# Patient Record
Sex: Male | Born: 1995 | Race: Black or African American | Hispanic: No | Marital: Single
Health system: Southern US, Community
[De-identification: ages and names within clinical notes are randomized; demographics above are authoritative.]

## PROBLEM LIST (undated history)

## (undated) ENCOUNTER — Emergency Department (HOSPITAL_COMMUNITY): Discharge status: Absent without leave | Payer: No Typology Code available for payment source

## (undated) HISTORY — PX: UVULECTOMY: SHX2631
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
## 2016-02-27 ENCOUNTER — Ambulatory Visit (INDEPENDENT_AMBULATORY_CARE_PROVIDER_SITE_OTHER): Payer: Medicaid Other | Admitting: Family Medicine

## 2016-02-27 VITALS — BP 126/62 | HR 62 | Temp 97.8°F | Ht 68.0 in | Wt 142.2 lb

## 2016-02-27 DIAGNOSIS — Z7689 Persons encountering health services in other specified circumstances: Secondary | ICD-10-CM

## 2016-02-27 DIAGNOSIS — Z634 Disappearance and death of family member: Secondary | ICD-10-CM | POA: Diagnosis not present

## 2016-02-27 DIAGNOSIS — Z7189 Other specified counseling: Secondary | ICD-10-CM

## 2016-02-27 MED ORDER — MULTIVITAMINS PO CAPS: 1.0000 | ORAL_CAPSULE | Freq: Every day | ORAL | 3 refills | Status: DC

## 2016-02-27 NOTE — Patient Instructions (Addendum)
It was very good to meet you today!  Come back to see [place] in 4 weeks. We will discuss about your tests and other concerns you may have at that time.   We will send you with some vitamins for your appetite.
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
## 2016-03-23 ENCOUNTER — Ambulatory Visit: Payer: Medicaid Other | Admitting: Student

## 2016-03-24 ENCOUNTER — Ambulatory Visit: Payer: Medicaid Other | Admitting: Student

## 2016-05-18 ENCOUNTER — Ambulatory Visit (HOSPITAL_COMMUNITY)
Admission: EM | Admit: 2016-05-18 | Discharge: 2016-05-18 | Disposition: A | Discharge status: Home / Self Care | Payer: Medicaid Other | Attending: Family Medicine | Admitting: Family Medicine

## 2016-05-18 ENCOUNTER — Ambulatory Visit: Payer: Medicaid Other | Admitting: Internal Medicine

## 2016-05-18 ENCOUNTER — Encounter (HOSPITAL_COMMUNITY): Payer: Self-pay | Admitting: Emergency Medicine

## 2016-05-18 DIAGNOSIS — J069 Acute upper respiratory infection, unspecified: Secondary | ICD-10-CM | POA: Diagnosis not present

## 2016-05-18 DIAGNOSIS — B9789 Other viral agents as the cause of diseases classified elsewhere: Secondary | ICD-10-CM

## 2016-05-18 MED ORDER — IPRATROPIUM BROMIDE 0.06 % NA SOLN: 2.0000 | Freq: Four times a day (QID) | NASAL | 1 refills | Status: DC

## 2016-05-18 MED ORDER — PSEUDOEPH-BROMPHEN-DM 30-2-10 MG/5ML PO SYRP: 7.5000 mL | ORAL_SOLUTION | Freq: Four times a day (QID) | ORAL | 1 refills | Status: DC | PRN

## 2016-05-18 NOTE — ED Provider Notes (Signed)
[facility]    CSN: [id number] Arrival date & time: 05/18/16  1336     History   Chief Complaint Chief Complaint  Patient presents with  . URI    HPI Allen Rivas is a 20 y.o. male.   The history is provided by the patient.  URI  Presenting symptoms: congestion, cough and rhinorrhea   Presenting symptoms: no fever and no sore throat   Severity:  Mild Onset quality:  Gradual Duration:  1 week Progression:  Unchanged Chronicity:  New Relieved by:  None tried Worsened by:  Nothing Ineffective treatments:  None tried Risk factors: sick contacts     History reviewed. No pertinent past medical history.  Patient Active Problem List   Diagnosis Date Noted  . Loss or death of parent 03/27/16    Past Surgical History:  Procedure Laterality Date  . UVULECTOMY         Home Medications    Prior to Admission medications   Medication Sig Start Date End Date Taking? Authorizing Provider  Multiple Vitamin (MULTIVITAMIN) capsule Take 1 capsule by mouth daily. 03/27/16   Alveda Reasons, MD    Family History No family history on file.  Social History Social History  Substance Use Topics  . Smoking status: Never Smoker  . Smokeless tobacco: Never Used  . Alcohol use No     Allergies   Patient has no known allergies.   Review of Systems Review of Systems  Constitutional: Negative for fever.  HENT: Positive for congestion, postnasal drip and rhinorrhea. Negative for sore throat and trouble swallowing.   Respiratory: Positive for cough. Negative for shortness of breath.   Cardiovascular: Negative.   All other systems reviewed and are negative.    Physical Exam Triage Vital Signs ED Triage Vitals  Enc Vitals Group     BP 05/18/16 1354 139/76     Pulse Rate 05/18/16 1354 73     Resp 05/18/16 1354 22     Temp 05/18/16 1354 98.7 F (37.1 C)     Temp src --      SpO2 05/18/16 1354 100 %     Weight --      Height --      Head  Circumference --      Peak Flow --      Pain Score 05/18/16 1352 5     Pain Loc --      Pain Edu? --      Excl. in [place]? --    No data found.   Updated Vital Signs BP 139/76 (BP Location: Left Arm)   Pulse 73   Temp 98.7 F (37.1 C)   Resp 22   SpO2 100%   Visual Acuity Right Eye Distance:   Left Eye Distance:   Bilateral Distance:    Right Eye Near:   Left Eye Near:    Bilateral Near:     Physical Exam  Constitutional: He is oriented to person, place, and time. He appears well-developed and well-nourished. No distress.  HENT:  Right Ear: External ear normal.  Left Ear: External ear normal.  Nose: Mucosal edema and rhinorrhea present.  Mouth/Throat: Oropharynx is clear and moist.  Eyes: Pupils are equal, round, and reactive to light.  Neck: Normal range of motion. Neck supple.  Cardiovascular: Normal rate, regular rhythm, normal heart sounds and intact distal pulses.   Pulmonary/Chest: Effort normal and breath sounds normal.  Abdominal: Soft. There is no tenderness.  Lymphadenopathy:  He has no cervical adenopathy.  Neurological: He is alert and oriented to person, place, and time.  Skin: Skin is warm and dry.  Nursing note and vitals reviewed.    UC Treatments / Results  Labs (all labs ordered are listed, but only abnormal results are displayed) Labs Reviewed - No data to display  EKG  EKG Interpretation None       Radiology No results found.  Procedures Procedures (including critical care time)  Medications Ordered in UC Medications - No data to display   Initial Impression / Assessment and Plan / UC Course  I have reviewed the triage vital signs and the nursing notes.  Pertinent labs & imaging results that were available during my care of the patient were reviewed by me and considered in my medical decision making (see chart for details).  Clinical Course       Final Clinical Impressions(s) / UC Diagnoses   Final diagnoses:  None     New Prescriptions New Prescriptions   No medications on file     Billy Fischer, MD 05/18/16 1430

## 2016-05-18 NOTE — ED Triage Notes (Addendum)
One week history of headache, runny nose, sinus congestion, sore throat and chest soreness with coughing.  [place] been [place]

## 2016-06-02 ENCOUNTER — Encounter: Payer: Self-pay | Admitting: Student

## 2016-06-02 ENCOUNTER — Other Ambulatory Visit (HOSPITAL_COMMUNITY)
Admission: RE | Admit: 2016-06-02 | Discharge: 2016-06-02 | Disposition: A | Discharge status: Home / Self Care | Payer: Medicaid Other | Source: Ambulatory Visit | Attending: Family Medicine | Admitting: Family Medicine

## 2016-06-02 ENCOUNTER — Ambulatory Visit (INDEPENDENT_AMBULATORY_CARE_PROVIDER_SITE_OTHER): Payer: Medicaid Other | Admitting: Student

## 2016-06-02 VITALS — BP 122/84 | HR 57 | Temp 98.0°F | Ht 68.0 in | Wt 146.6 lb

## 2016-06-02 DIAGNOSIS — Z113 Encounter for screening for infections with a predominantly sexual mode of transmission: Secondary | ICD-10-CM | POA: Insufficient documentation

## 2016-06-02 DIAGNOSIS — Z0000 Encounter for general adult medical examination without abnormal findings: Secondary | ICD-10-CM

## 2016-06-02 DIAGNOSIS — Z7251 High risk heterosexual behavior: Secondary | ICD-10-CM

## 2016-06-02 DIAGNOSIS — Z23 Encounter for immunization: Secondary | ICD-10-CM | POA: Diagnosis present

## 2016-06-02 NOTE — Progress Notes (Signed)
  Subjective:    Allen Rivas is a 20 y.o. old male here for annual physical. In person interpretor by the name Lyndon Code from [place] was used for the entire encounter  HPI  Briefly, a 20 year old male originally from [place] who arrived in the [place] less than a year ago. Patient was initially seen in our clinic 3 months ago. At that visits he reported poor appetite that has resolved with multivitamin. He has no concern today. He states that he has been to a dentist recently.   History and Problem List: Boyce has Loss or death of parent on his problem list.  PMH: no significant past medical history   Social history: he is working. Reports regular exercise. He plays soccer. He denies smoking, drinking or recreational drug use. One sexual partner. Denies condom use.   Immunizations needed: influenza and TdaP  Review of Systems  Constitutional: Negative for appetite change, fatigue, fever and unexpected weight change.  Eyes: Negative for visual disturbance.  Respiratory: Negative for chest tightness and shortness of breath.   Cardiovascular: Negative for chest pain.  Gastrointestinal: Negative for blood in stool, constipation and diarrhea.  Endocrine: Negative for polydipsia, polyphagia and polyuria.  Genitourinary: Negative for dysuria, hematuria, penile swelling and scrotal swelling.  Musculoskeletal: Negative for arthralgias and myalgias.  Skin: Negative for pallor and rash.  Neurological: Negative for dizziness and light-headedness.  Hematological: Does not bruise/bleed easily.  Psychiatric/Behavioral: Negative for behavioral problems and dysphoric mood.      Objective:    BP 122/84   Pulse (!) 57   Temp 98 F (36.7 C) (Oral)   Ht 5\' 8"  (1.727 m)   Wt 146 lb 9.6 oz (66.5 kg)   SpO2 99%   BMI 22.29 kg/m  Physical Exam GEN: appears well, no apparent distress. Head: normocephalic and atraumatic  Eyes: some conjunctival injection (muddy sclera), PERRL Ears: normal TM and ear  canal,  Nares: no rhinorrhea, congestion or erythema,  Oropharynx: mmm without erythema or exudation. No dental caries HEM: negative for cervical or periauricular lymphadenopathies CVS: RRR, normal s1 and s2, no murmurs, no edema RESP: no increased work of breathing, good air movement bilaterally, no crackles or wheeze GI: Bowel sounds present and normal, soft, non-tender, non-distended, no guarding, no rebound, no mass GU: deferred MSK: No focal tenderness SKIN: No apparent skin lesion ENDO: negative thyromegally NEURO: alert and oiented appropriately, no gross defecits  PSYCH: appropriate mood and affect.    Assessment and Plan:     Harder was seen today for annual physical. He has no significant past medical history. We have discussed about healthy lifestyle including diet, exercise and safe sex practice. He is given handout about diet and exercise.  We have also addressed the following problems:   Problem List Items Addressed This Visit    None    Visit Diagnoses    Unprotected sex    -  Primary   Relevant Orders   Urine cytology ancillary only   HIV antibody   Encounter for immunization       Relevant Orders   Flu Vaccine QUAD 36+ mos IM (Completed)   Vaccine for diphtheria-tetanus-pertussis, combined       Relevant Orders   Tdap vaccine greater than or equal to 7yo IM (Completed)      Return in about 1 year (around 06/02/2017).  Mercy Riding, MD

## 2016-06-02 NOTE — Patient Instructions (Signed)
It was great seeing you today! Portion Size    Choose healthier foods such as 100% whole grains, vegetables, fruits, beans, nut seeds, olive oil, most vegetable oils, fat-free dietary, wild game and fish.   Avoid sweet tea, other sweetened beverages, soda, fruit juice, cold cereal and milk and trans fat.   Eat at least 3 meals and 1-2 snacks per day.  Aim for no more than 5 hours between eating.  Eat breakfast within one hour of getting up.    Exercise at least 150 minutes per week, including weight resistance exercises 3 or 4 times per week.    If we did any lab work today, and the results require attention, either me or my nurse will get in touch with you. If everything is normal, you will get a letter in mail. If you don't hear from [place] in two weeks, please give [place] a call. Otherwise, we look forward to seeing you again at your next visit. If you have any questions or concerns before then, please call the clinic at [phone].   Please bring all your medications to every doctors visit   Sign up for My Chart to have easy access to your labs results, and communication with your Primary care physician.     Please check-out at the front desk before leaving the clinic.

## 2016-06-03 ENCOUNTER — Encounter: Payer: Self-pay | Admitting: Student

## 2016-06-03 LAB — URINE CYTOLOGY ANCILLARY ONLY
Chlamydia: NEGATIVE
Neisseria Gonorrhea: NEGATIVE
TRICH (WINDOWPATH): NEGATIVE

## 2016-06-03 LAB — HIV ANTIBODY (ROUTINE TESTING W REFLEX): HIV 1&2 Ab, 4th Generation: NONREACTIVE

## 2016-06-03 NOTE — Progress Notes (Signed)
HIV/GC/CT/Trich normal. Result letter sent to patient

## 2017-05-23 ENCOUNTER — Emergency Department (HOSPITAL_COMMUNITY)
Admission: EM | Admit: 2017-05-23 | Discharge: 2017-05-23 | Disposition: A | Discharge status: Home / Self Care | Payer: Self-pay | Attending: Emergency Medicine | Admitting: Emergency Medicine

## 2017-05-23 ENCOUNTER — Other Ambulatory Visit: Payer: Self-pay

## 2017-05-23 ENCOUNTER — Encounter (HOSPITAL_COMMUNITY): Payer: Self-pay

## 2017-05-23 ENCOUNTER — Emergency Department (HOSPITAL_COMMUNITY): Payer: Self-pay

## 2017-05-23 DIAGNOSIS — S20211A Contusion of right front wall of thorax, initial encounter: Secondary | ICD-10-CM | POA: Insufficient documentation

## 2017-05-23 DIAGNOSIS — Y9389 Activity, other specified: Secondary | ICD-10-CM | POA: Insufficient documentation

## 2017-05-23 DIAGNOSIS — Y998 Other external cause status: Secondary | ICD-10-CM | POA: Insufficient documentation

## 2017-05-23 DIAGNOSIS — Y92410 Unspecified street and highway as the place of occurrence of the external cause: Secondary | ICD-10-CM | POA: Insufficient documentation

## 2017-05-23 DIAGNOSIS — F17210 Nicotine dependence, cigarettes, uncomplicated: Secondary | ICD-10-CM | POA: Insufficient documentation

## 2017-05-23 MED ORDER — IBUPROFEN 600 MG PO TABS: 600.0000 mg | ORAL_TABLET | Freq: Four times a day (QID) | ORAL | 0 refills | Status: DC | PRN
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
## 2017-05-23 NOTE — Discharge Instructions (Signed)
Return if any problems.

## 2017-05-26 ENCOUNTER — Other Ambulatory Visit: Payer: Self-pay

## 2017-05-26 ENCOUNTER — Emergency Department (HOSPITAL_COMMUNITY)
Admission: EM | Admit: 2017-05-26 | Discharge: 2017-05-26 | Disposition: A | Discharge status: Home / Self Care | Payer: Self-pay | Attending: Emergency Medicine | Admitting: Emergency Medicine

## 2017-05-26 ENCOUNTER — Encounter (HOSPITAL_COMMUNITY): Payer: Self-pay

## 2017-05-26 ENCOUNTER — Emergency Department (HOSPITAL_COMMUNITY): Payer: Self-pay

## 2017-05-26 DIAGNOSIS — Z79899 Other long term (current) drug therapy: Secondary | ICD-10-CM | POA: Insufficient documentation

## 2017-05-26 DIAGNOSIS — F17210 Nicotine dependence, cigarettes, uncomplicated: Secondary | ICD-10-CM | POA: Insufficient documentation

## 2017-05-26 DIAGNOSIS — M7918 Myalgia, other site: Secondary | ICD-10-CM

## 2017-05-26 DIAGNOSIS — M791 Myalgia, unspecified site: Secondary | ICD-10-CM | POA: Insufficient documentation

## 2017-05-26 MED ORDER — CYCLOBENZAPRINE HCL 10 MG PO TABS: 10.0000 mg | ORAL_TABLET | Freq: Two times a day (BID) | ORAL | 0 refills | Status: DC | PRN

## 2017-05-26 MED ORDER — CYCLOBENZAPRINE HCL 10 MG PO TABS
10.0000 mg | ORAL_TABLET | Freq: Once | ORAL | Status: AC
  Administered 2017-05-26: 10 mg via ORAL
  Filled 2017-05-26: qty 1
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
## 2017-05-26 NOTE — Discharge Instructions (Signed)
Do not take the muscle relaxer at work or while driving because it will make you sleepy.

## 2017-05-26 NOTE — ED Notes (Signed)
Pt decline d/c vitals. He states that he has to get to an appointment.
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
## 2018-01-10 ENCOUNTER — Encounter (HOSPITAL_COMMUNITY): Payer: Self-pay

## 2018-01-10 ENCOUNTER — Other Ambulatory Visit: Payer: Self-pay

## 2018-01-10 ENCOUNTER — Emergency Department (HOSPITAL_COMMUNITY): Payer: No Typology Code available for payment source

## 2018-01-10 ENCOUNTER — Emergency Department (HOSPITAL_COMMUNITY)
Admission: EM | Admit: 2018-01-10 | Discharge: 2018-01-10 | Disposition: A | Discharge status: Home / Self Care | Payer: No Typology Code available for payment source | Attending: Emergency Medicine | Admitting: Emergency Medicine

## 2018-01-10 DIAGNOSIS — F17210 Nicotine dependence, cigarettes, uncomplicated: Secondary | ICD-10-CM | POA: Diagnosis not present

## 2018-01-10 DIAGNOSIS — M7918 Myalgia, other site: Secondary | ICD-10-CM | POA: Insufficient documentation

## 2018-01-10 NOTE — ED Notes (Signed)
Pt reports increased chest wall pain and pressure on inspiration.

## 2018-01-10 NOTE — Discharge Instructions (Signed)
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
## 2018-03-13 ENCOUNTER — Emergency Department (HOSPITAL_COMMUNITY)
Admission: EM | Admit: 2018-03-13 | Discharge: 2018-03-14 | Disposition: A | Discharge status: Home / Self Care | Payer: Self-pay | Attending: Emergency Medicine | Admitting: Emergency Medicine

## 2018-03-13 ENCOUNTER — Encounter (HOSPITAL_COMMUNITY): Payer: Self-pay | Admitting: Emergency Medicine

## 2018-03-13 DIAGNOSIS — Z5321 Procedure and treatment not carried out due to patient leaving prior to being seen by health care provider: Secondary | ICD-10-CM | POA: Insufficient documentation

## 2018-03-13 DIAGNOSIS — M79621 Pain in right upper arm: Secondary | ICD-10-CM | POA: Insufficient documentation
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
## 2018-03-13 NOTE — ED Notes (Signed)
Pt LWBS 

## 2018-04-18 ENCOUNTER — Emergency Department (HOSPITAL_COMMUNITY): Payer: Self-pay

## 2018-04-18 ENCOUNTER — Emergency Department (HOSPITAL_COMMUNITY)
Admission: EM | Admit: 2018-04-18 | Discharge: 2018-04-18 | Disposition: A | Discharge status: Home / Self Care | Payer: Self-pay | Attending: Emergency Medicine | Admitting: Emergency Medicine

## 2018-04-18 ENCOUNTER — Encounter (HOSPITAL_COMMUNITY): Payer: Self-pay

## 2018-04-18 DIAGNOSIS — M25511 Pain in right shoulder: Secondary | ICD-10-CM | POA: Insufficient documentation

## 2018-04-18 DIAGNOSIS — Z5321 Procedure and treatment not carried out due to patient leaving prior to being seen by health care provider: Secondary | ICD-10-CM | POA: Insufficient documentation
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
## 2018-04-18 NOTE — ED Triage Notes (Signed)
Pt didn't answer when called from triage

## 2018-04-19 ENCOUNTER — Emergency Department (HOSPITAL_COMMUNITY): Payer: No Typology Code available for payment source

## 2018-04-19 ENCOUNTER — Other Ambulatory Visit: Payer: Self-pay

## 2018-04-19 ENCOUNTER — Emergency Department (HOSPITAL_COMMUNITY)
Admission: EM | Admit: 2018-04-19 | Discharge: 2018-04-19 | Disposition: A | Discharge status: Home / Self Care | Payer: No Typology Code available for payment source | Attending: Emergency Medicine | Admitting: Emergency Medicine

## 2018-04-19 DIAGNOSIS — F17210 Nicotine dependence, cigarettes, uncomplicated: Secondary | ICD-10-CM | POA: Diagnosis not present

## 2018-04-19 DIAGNOSIS — M25511 Pain in right shoulder: Secondary | ICD-10-CM | POA: Insufficient documentation

## 2018-04-19 DIAGNOSIS — G8929 Other chronic pain: Secondary | ICD-10-CM

## 2018-04-19 MED ORDER — IBUPROFEN 800 MG PO TABS: 800.0000 mg | ORAL_TABLET | Freq: Three times a day (TID) | ORAL | 0 refills | Status: DC
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
## 2018-04-19 NOTE — ED Notes (Signed)
Bed: WA03 Expected date:  Expected time:  Means of arrival:  Comments: 

## 2018-10-24 ENCOUNTER — Encounter (HOSPITAL_COMMUNITY): Payer: Self-pay

## 2018-10-24 ENCOUNTER — Ambulatory Visit (HOSPITAL_COMMUNITY)
Admission: EM | Admit: 2018-10-24 | Discharge: 2018-10-24 | Disposition: A | Discharge status: Home / Self Care | Payer: Self-pay | Attending: Family Medicine | Admitting: Family Medicine

## 2018-10-24 ENCOUNTER — Other Ambulatory Visit: Payer: Self-pay

## 2018-10-24 DIAGNOSIS — R1013 Epigastric pain: Secondary | ICD-10-CM

## 2018-10-24 MED ORDER — OMEPRAZOLE 20 MG PO CPDR: 20.0000 mg | DELAYED_RELEASE_CAPSULE | Freq: Every day | ORAL | 0 refills | Status: DC

## 2018-10-24 NOTE — ED Triage Notes (Signed)
Pt states he has had stomach pain for past month after he eats
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
## 2019-01-13 ENCOUNTER — Emergency Department (HOSPITAL_COMMUNITY)
Admission: EM | Admit: 2019-01-13 | Discharge: 2019-01-14 | Disposition: A | Discharge status: Home / Self Care | Payer: Self-pay | Attending: Emergency Medicine | Admitting: Emergency Medicine

## 2019-01-13 ENCOUNTER — Emergency Department (HOSPITAL_COMMUNITY): Payer: Self-pay

## 2019-01-13 ENCOUNTER — Other Ambulatory Visit: Payer: Self-pay

## 2019-01-13 DIAGNOSIS — Y908 Blood alcohol level of 240 mg/100 ml or more: Secondary | ICD-10-CM | POA: Insufficient documentation

## 2019-01-13 DIAGNOSIS — Z03818 Encounter for observation for suspected exposure to other biological agents ruled out: Secondary | ICD-10-CM | POA: Insufficient documentation

## 2019-01-13 DIAGNOSIS — F10929 Alcohol use, unspecified with intoxication, unspecified: Secondary | ICD-10-CM | POA: Insufficient documentation

## 2019-01-13 DIAGNOSIS — F17210 Nicotine dependence, cigarettes, uncomplicated: Secondary | ICD-10-CM | POA: Insufficient documentation

## 2019-01-13 LAB — COMPREHENSIVE METABOLIC PANEL
ALT: 43 U/L (ref 0–44)
AST: 61 U/L — ABNORMAL HIGH (ref 15–41)
Albumin: 4.1 g/dL (ref 3.5–5.0)
Alkaline Phosphatase: 55 U/L (ref 38–126)
Anion gap: 13 (ref 5–15)
BUN: 6 mg/dL (ref 6–20)
CO2: 17 mmol/L — ABNORMAL LOW (ref 22–32)
Calcium: 8.4 mg/dL — ABNORMAL LOW (ref 8.9–10.3)
Chloride: 114 mmol/L — ABNORMAL HIGH (ref 98–111)
Creatinine, Ser: 1.16 mg/dL (ref 0.61–1.24)
GFR calc Af Amer: >60 mL/min (ref >60)
GFR calc non Af Amer: >60 mL/min (ref >60)
Glucose, Bld: 73 mg/dL (ref 70–99)
Potassium: 3.7 mmol/L (ref 3.5–5.1)
Sodium: 144 mmol/L (ref 135–145)
Total Bilirubin: 0.5 mg/dL (ref 0.3–1.2)
Total Protein: 7 g/dL (ref 6.5–8.1)

## 2019-01-13 LAB — CK: Total CK: 140 U/L (ref 49–397)

## 2019-01-13 LAB — CBC WITH DIFFERENTIAL/PLATELET
Abs Immature Granulocytes: 0.02 10*3/uL (ref 0.00–0.07)
Basophils Absolute: 0 10*3/uL (ref 0.0–0.1)
Basophils Relative: 0 %
Eosinophils Absolute: 0.1 10*3/uL (ref 0.0–0.5)
Eosinophils Relative: 1 %
HCT: 44.8 % (ref 39.0–52.0)
Hemoglobin: 15.9 g/dL (ref 13.0–17.0)
Immature Granulocytes: 0 %
Lymphocytes Relative: 35 %
Lymphs Abs: 2.5 10*3/uL (ref 0.7–4.0)
MCH: 31.5 pg (ref 26.0–34.0)
MCHC: 35.5 g/dL (ref 30.0–36.0)
MCV: 88.9 fL (ref 80.0–100.0)
Monocytes Absolute: 0.6 10*3/uL (ref 0.1–1.0)
Monocytes Relative: 9 %
Neutro Abs: 3.8 10*3/uL (ref 1.7–7.7)
Neutrophils Relative %: 55 %
Platelets: 244 10*3/uL (ref 150–400)
RBC: 5.04 MIL/uL (ref 4.22–5.81)
RDW: 12.8 % (ref 11.5–15.5)
WBC: 7.1 10*3/uL (ref 4.0–10.5)
nRBC: 0 % (ref 0.0–0.2)

## 2019-01-13 LAB — URINALYSIS, ROUTINE W REFLEX MICROSCOPIC
Bilirubin Urine: NEGATIVE
Glucose, UA: 50 mg/dL — AB
Hgb urine dipstick: NEGATIVE
Ketones, ur: NEGATIVE mg/dL
Leukocytes,Ua: NEGATIVE
Nitrite: NEGATIVE
Protein, ur: NEGATIVE mg/dL
Specific Gravity, Urine: 1.004 — ABNORMAL LOW (ref 1.005–1.030)
pH: 5 (ref 5.0–8.0)

## 2019-01-13 LAB — I-STAT CHEM 8, ED
BUN: 5 mg/dL — ABNORMAL LOW (ref 6–20)
Calcium, Ion: 1.06 mmol/L — ABNORMAL LOW (ref 1.15–1.40)
Chloride: 114 mmol/L — ABNORMAL HIGH (ref 98–111)
Creatinine, Ser: 1.5 mg/dL — ABNORMAL HIGH (ref 0.61–1.24)
Glucose, Bld: 71 mg/dL (ref 70–99)
HCT: 47 % (ref 39.0–52.0)
Hemoglobin: 16 g/dL (ref 13.0–17.0)
Potassium: 3.7 mmol/L (ref 3.5–5.1)
Sodium: 145 mmol/L (ref 135–145)
TCO2: 18 mmol/L — ABNORMAL LOW (ref 22–32)

## 2019-01-13 LAB — ETHANOL: Alcohol, Ethyl (B): 327 mg/dL (ref <10)

## 2019-01-13 LAB — RAPID URINE DRUG SCREEN, HOSP PERFORMED
Amphetamines: NOT DETECTED
Barbiturates: NOT DETECTED
Benzodiazepines: NOT DETECTED
Cocaine: NOT DETECTED
Opiates: NOT DETECTED
Tetrahydrocannabinol: NOT DETECTED

## 2019-01-13 LAB — ACETAMINOPHEN LEVEL: Acetaminophen (Tylenol), Serum: <10 ug/mL — ABNORMAL LOW (ref 10–30)

## 2019-01-13 LAB — CBG MONITORING, ED: Glucose-Capillary: 76 mg/dL (ref 70–99)

## 2019-01-13 LAB — SALICYLATE LEVEL: Salicylate Lvl: <7 mg/dL (ref 2.8–30.0)

## 2019-01-13 LAB — SARS CORONAVIRUS 2 BY RT PCR (HOSPITAL ORDER, PERFORMED IN ~~LOC~~ HOSPITAL LAB): SARS Coronavirus 2: NEGATIVE

## 2019-01-13 MED ORDER — LORAZEPAM 2 MG/ML IJ SOLN: 2.0000 mg | Freq: Once | INTRAMUSCULAR | Status: DC

## 2019-01-13 MED ORDER — LACTATED RINGERS IV BOLUS
1000.0000 mL | Freq: Once | INTRAVENOUS | Status: AC
  Administered 2019-01-13: 21:00:00 1000 mL via INTRAVENOUS

## 2019-01-13 MED ORDER — LACTATED RINGERS IV BOLUS
1000.0000 mL | Freq: Once | INTRAVENOUS | Status: AC
  Administered 2019-01-13: 19:00:00 1000 mL via INTRAVENOUS

## 2019-01-13 MED ORDER — ZIPRASIDONE MESYLATE 20 MG IM SOLR
20.0000 mg | Freq: Once | INTRAMUSCULAR | Status: AC
  Administered 2019-01-13: 19:00:00 20 mg via INTRAMUSCULAR

## 2019-01-13 MED ORDER — LORAZEPAM 2 MG/ML IJ SOLN
INTRAMUSCULAR | Status: AC
  Administered 2019-01-13: 2 mg via INTRAVENOUS
  Filled 2019-01-13: qty 1
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
## 2019-01-13 NOTE — ED Notes (Signed)
Pt woke up to urinate.  He was able to follow directions.  Pt went back to sleep.

## 2019-01-13 NOTE — ED Provider Notes (Signed)
[facility] EMERGENCY DEPARTMENT Provider Note   CSN: [id number] Arrival date & time: 01/13/19  1851    LEVEL 5 CAVEAT - ALTERED MENTAL STATUS   History   Chief Complaint Chief Complaint  Patient presents with  . Altered Mental Status    HPI Allen Rivas is a 23 y.o. male.     HPI  23 year old male presents with acute agitation/altered mental status.  He was brought in by EMS.  Police were called to a domestic dispute, patient gave him the wrong name and then eventually ran.  He was in handcuffs and agitated.  Per EMS the patient kept seeming to go unresponsive and may be some mildly decreased respiratory effort but was always breathing.  Otherwise history is very limited as the patient is agitated and not cooperative.  No past medical history on file.  Patient Active Problem List   Diagnosis Date Noted  . Loss or death of parent March 28, 2016    Past Surgical History:  Procedure Laterality Date  . UVULECTOMY          Home Medications    Prior to Admission medications   Medication Sig Start Date End Date Taking? Authorizing Provider  ibuprofen (ADVIL,MOTRIN) 800 MG tablet Take 1 tablet (800 mg total) by mouth 3 (three) times daily. 04/19/18   Montine Circle, PA-C  omeprazole (PRILOSEC) 20 MG capsule Take 1 capsule (20 mg total) by mouth daily. 10/24/18   Vanessa Kick, MD    Family History Family History  Family history unknown: Yes    Social History Social History   Tobacco Use  . Smoking status: Current Every Day Smoker    Packs/day: 0.15    Types: Cigarettes  . Smokeless tobacco: Never Used  Substance Use Topics  . Alcohol use: No  . Drug use: No     Allergies   Patient has no known allergies.   Review of Systems Review of Systems  Unable to perform ROS: Mental status change     Physical Exam Updated Vital Signs BP (!) 145/92   Pulse 78   Temp 97.7 F (36.5 C) (Rectal)   Resp 13   Ht 5\' 10"  (1.778 m)   Wt 61 kg    SpO2 99%   BMI 19.30 kg/m   Physical Exam Vitals signs and nursing note reviewed.  Constitutional:      General: He is in acute distress.     Appearance: He is well-developed.  HENT:     Head: Normocephalic.      Right Ear: External ear normal.     Left Ear: External ear normal.     Nose: Nose normal.  Eyes:     General:        Right eye: No discharge.        Left eye: No discharge.     Comments: Blood shot eyes bilaterally  Neck:     Musculoskeletal: Neck supple.  Cardiovascular:     Rate and Rhythm: Regular rhythm. Tachycardia present.     Heart sounds: Normal heart sounds.  Pulmonary:     Effort: Pulmonary effort is normal.     Breath sounds: Normal breath sounds.  Abdominal:     Palpations: Abdomen is soft.     Tenderness: There is no abdominal tenderness.  Skin:    General: Skin is warm and dry.  Neurological:     Mental Status: He is alert. He is confused.     Comments: Awake, alert, but  very agitated. Appears disoriented, keeps asking for wife. Moves all 4 extremities but does not follow commands  Psychiatric:        Behavior: Behavior is uncooperative, agitated and aggressive.      ED Treatments / Results  Labs (all labs ordered are listed, but only abnormal results are displayed) Labs Reviewed  COMPREHENSIVE METABOLIC PANEL - Abnormal; Notable for the following components:      Result Value   Chloride 114 (*)    CO2 17 (*)    Calcium 8.4 (*)    AST 61 (*)    All other components within normal limits  ETHANOL - Abnormal; Notable for the following components:   Alcohol, Ethyl (B) 327 (*)    All other components within normal limits  ACETAMINOPHEN LEVEL - Abnormal; Notable for the following components:   Acetaminophen (Tylenol), Serum <10 (*)    All other components within normal limits  URINALYSIS, ROUTINE W REFLEX MICROSCOPIC - Abnormal; Notable for the following components:   Color, Urine STRAW (*)    Specific Gravity, Urine 1.004 (*)    Glucose,  UA 50 (*)    All other components within normal limits  I-STAT CHEM 8, ED - Abnormal; Notable for the following components:   Chloride 114 (*)    BUN 5 (*)    Creatinine, Ser 1.50 (*)    Calcium, Ion 1.06 (*)    TCO2 18 (*)    All other components within normal limits  SARS CORONAVIRUS 2 (HOSPITAL ORDER, [facility])  SALICYLATE LEVEL  RAPID URINE DRUG SCREEN, HOSP PERFORMED  CBC WITH DIFFERENTIAL/PLATELET  CK  CBG MONITORING, ED    EKG EKG Interpretation  Date/Time:  Saturday January 13 2019 19:09:15 EDT Ventricular Rate:  109 PR Interval:    QRS Duration: 89 QT Interval:  326 QTC Calculation: 439 R Axis:   87 Text Interpretation:  Sinus tachycardia Biatrial enlargement rate faster compared to July 2019 Confirmed by Sherwood Gambler [phone]) on 01/13/2019 10:19:58 PM   Radiology Ct Head Wo Contrast  Result Date: 01/13/2019 CLINICAL DATA:  Altered mental status EXAM: CT HEAD WITHOUT CONTRAST TECHNIQUE: Contiguous axial images were obtained from the base of the skull through the vertex without intravenous contrast. COMPARISON:  None. FINDINGS: Brain: No evidence of acute infarction, hemorrhage, hydrocephalus, extra-axial collection or mass lesion/mass effect. Vascular: No hyperdense vessel or unexpected calcification. Skull: Normal. Negative for fracture or focal lesion. Sinuses/Orbits: Mild mucosal thickening of the ethmoid air cells. Other: None. IMPRESSION: No acute intracranial pathology. Electronically Signed   By: Eddie Candle M.D.   On: 01/13/2019 20:54   Dg Chest Portable 1 View  Result Date: 01/13/2019 CLINICAL DATA:  AMS EXAM: PORTABLE CHEST 1 VIEW COMPARISON:  01/10/2018 FINDINGS: The heart size and mediastinal contours are within normal limits. Both lungs are clear. The visualized skeletal structures are unremarkable. IMPRESSION: No acute abnormality of the lungs in AP portable projection. Electronically Signed   By: Eddie Candle M.D.   On:  01/13/2019 19:42    Procedures .Critical Care Performed by: Sherwood Gambler, MD Authorized by: Sherwood Gambler, MD   Critical care provider statement:    Critical care time (minutes):  35   Critical care time was exclusive of:  Separately billable procedures and treating other patients   Critical care was necessary to treat or prevent imminent or life-threatening deterioration of the following conditions:  Shock and CNS failure or compromise   Critical care was time spent personally  by me on the following activities:  Discussions with consultants, evaluation of patient's response to treatment, examination of patient, ordering and performing treatments and interventions, ordering and review of laboratory studies, ordering and review of radiographic studies, pulse oximetry, re-evaluation of patient's condition, obtaining history from patient or surrogate and review of old charts   (including critical care time)  Medications Ordered in ED Medications  lactated ringers bolus 1,000 mL (0 mLs Intravenous Stopped 01/13/19 2057)  LORazepam (ATIVAN) 2 MG/ML injection (2 mg Intravenous Given 01/13/19 1900)  ziprasidone (GEODON) injection 20 mg (20 mg Intramuscular Given 01/13/19 1858)  lactated ringers bolus 1,000 mL (0 mLs Intravenous Stopped 01/13/19 2057)     Initial Impression / Assessment and Plan / ED Course  I have reviewed the triage vital signs and the nursing notes.  Pertinent labs & imaging results that were available during my care of the patient were reviewed by me and considered in my medical decision making (see chart for details).        Patient is agitated and aggressive on arrival and was given Geodon and Ativan.  He is much more calm and resting.  Work-up shows alcohol intoxication which is likely the cause of the majority of his symptoms, possibly some drug abuse as well.  Otherwise, his vital signs have stabilized.  At this point, he is still pretty out of it and I think we  will need to sober up more before he can be discharged in police custody.  Care transferred to Dr. Leonides Schanz  Final Clinical Impressions(s) / ED Diagnoses   Final diagnoses:  Alcoholic intoxication with complication [facility])    ED Discharge Orders    None       Sherwood Gambler, MD 01/13/19 [phone]

## 2019-01-13 NOTE — ED Triage Notes (Signed)
Per [facility], pt was being pursued by police after having domestic dispute with a male. He resisted arrest, was yelling screaming, kicking, had to be restrained. Placed in back of police car, kicking the door and the window, then had syncopal episode. EMS on scene noted that pt's RR's were 6 and shallow, started bagging pt until arrival here. Pt's CBG 47 in route, given amp of d10 and CBG up to 187. Pt has strong smell of etoh, blood shot eyes, and hx of drug abuse. Pt placed in violent restraints, given 20 of geodon and 2mg  ativan after IV access gained. Pt had 16ga IV in left AC but pulled it out fighting with hospital staff and police. 4 RN's, 2 EMT's, RT, [employer], and 3 [employer] officers at bedside restraining patient. Pt constantly screaming "you are trying to kill me because I'm black"

## 2019-01-14 LAB — CBG MONITORING, ED: Glucose-Capillary: 79 mg/dL (ref 70–99)

## 2019-01-14 MED ORDER — IBUPROFEN 800 MG PO TABS
800.0000 mg | ORAL_TABLET | Freq: Once | ORAL | Status: AC
  Administered 2019-01-14: 06:00:00 800 mg via ORAL
  Filled 2019-01-14: qty 1

## 2019-01-14 NOTE — ED Notes (Signed)
Pt able to ambulate down hallway with no assist. Gave pt water to drink

## 2019-01-14 NOTE — ED Notes (Signed)
Pt asleep in hallway stretcher with [employer] at bedside

## 2019-01-14 NOTE — ED Notes (Addendum)
Patient verbalizes understanding of discharge instructions. Opportunity for questioning and answers were provided. Armband removed by staff, pt discharged from ED ambulatory in custody with [employer]

## 2019-01-14 NOTE — Discharge Instructions (Addendum)
You may alternate Tylenol 1000 mg every 6 hours as needed for pain and Ibuprofen 800 mg every 8 hours as needed for pain.  Please take Ibuprofen with food.  Steps to find a Primary Care Provider (PCP):  Call [phone] or [phone] to access "[facility]."  2.  You may also go on the [facility] website at [email or web address]  3.   and Wellness also frequently accepts new patients.  [facility] [phone]  4.  There are also multiple [facility], [facility] and [facility] practices throughout the [place] that are frequently accepting new patients. You may find a clinic that is close to your home and contact them.  [facility] [email or web address] [phone]  [facility] [email or web address]  [facility] [email or web address] [place] [email or web address] [phone]  5.  Local Health Departments also can provide primary care services.  [facility]  [place] [address] [phone]  [facility] [place] [address] [place] [facility] [place] [phone]

## 2019-02-07 ENCOUNTER — Encounter (HOSPITAL_COMMUNITY): Payer: Self-pay | Admitting: Emergency Medicine

## 2019-02-07 ENCOUNTER — Other Ambulatory Visit: Payer: Self-pay

## 2019-02-07 ENCOUNTER — Ambulatory Visit (HOSPITAL_COMMUNITY)
Admission: EM | Admit: 2019-02-07 | Discharge: 2019-02-07 | Disposition: A | Discharge status: Home / Self Care | Payer: Self-pay | Attending: Family Medicine | Admitting: Family Medicine

## 2019-02-07 DIAGNOSIS — M779 Enthesopathy, unspecified: Secondary | ICD-10-CM

## 2019-02-07 DIAGNOSIS — M778 Other enthesopathies, not elsewhere classified: Secondary | ICD-10-CM

## 2019-02-07 MED ORDER — NAPROXEN 375 MG PO TABS: 375.0000 mg | ORAL_TABLET | Freq: Two times a day (BID) | ORAL | 0 refills | Status: DC

## 2019-02-07 NOTE — Discharge Instructions (Signed)
Ice of the affected area at the end of the day.  Use of brace while active to provide support to the hand and thumb.  Wean out of it as able.  Naproxen twice a day, take with food.  Follow up with you primary care provider for any persistent symptoms.

## 2019-02-07 NOTE — ED Provider Notes (Signed)
[name]    CSN: [id number] Arrival date & time: 02/07/19  1531     History   Chief Complaint Chief Complaint  Patient presents with  . Hand Pain    HPI Allen Rivas is a 23 y.o. male.   Allen Rivas presents with complaints of right wrist/thumb pain. Started approximately 2 weeks ago when he started a new job at the [employer]. He is right handed. Pain is worse when he is working/using his hand. Minimal to mild pain only right now, 3/10 in severity. He tried an otc medication for pain, not sure what it was, but it didn't help. Denies any previous injury to the hand or wrist. No swelling, redness or warmth. No specific injury to the affected area. No numbness or tingling. Without contributing medical history.      ROS per HPI, negative if not otherwise mentioned.      History reviewed. No pertinent past medical history.  Patient Active Problem List   Diagnosis Date Noted  . Loss or death of parent 03-22-16    Past Surgical History:  Procedure Laterality Date  . UVULECTOMY         Home Medications    Prior to Admission medications   Medication Sig Start Date End Date Taking? Authorizing Provider  naproxen (NAPROSYN) 375 MG tablet Take 1 tablet (375 mg total) by mouth 2 (two) times daily. 02/07/19   Zigmund Gottron, NP  omeprazole (PRILOSEC) 20 MG capsule Take 1 capsule (20 mg total) by mouth daily. 10/24/18   Vanessa Kick, MD    Family History Family History  Family history unknown: Yes    Social History Social History   Tobacco Use  . Smoking status: Current Every Day Smoker    Packs/day: 0.15    Types: Cigarettes  . Smokeless tobacco: Never Used  Substance Use Topics  . Alcohol use: No  . Drug use: No     Allergies   Patient has no known allergies.   Review of Systems Review of Systems   Physical Exam Triage Vital Signs ED Triage Vitals  Enc Vitals Group     BP 02/07/19 1604 131/65     Pulse Rate 02/07/19 1604  92     Resp 02/07/19 1604 18     Temp 02/07/19 1604 98.1 F (36.7 C)     Temp Source 02/07/19 1604 Oral     SpO2 02/07/19 1604 99 %     Weight --      Height --      Head Circumference --      Peak Flow --      Pain Score 02/07/19 1605 4     Pain Loc --      Pain Edu? --      Excl. in [place]? --    No data found.  Updated Vital Signs BP 131/65 (BP Location: Right Arm)   Pulse 92   Temp 98.1 F (36.7 C) (Oral)   Resp 18   SpO2 99%    Physical Exam Constitutional:      Appearance: He is well-developed.  Cardiovascular:     Rate and Rhythm: Normal rate.  Pulmonary:     Effort: Pulmonary effort is normal.  Musculoskeletal:     Right wrist: He exhibits tenderness. He exhibits normal range of motion, no bony tenderness, no swelling, no effusion, no crepitus, no deformity and no laceration.     Right hand: He exhibits tenderness. He exhibits normal range of  motion, no bony tenderness, normal two-point discrimination, normal capillary refill, no deformity, no laceration and no swelling. Normal sensation noted. Normal strength noted.       Hands:     Comments: Proximal thumb and dorsal aspect of proximal hand/wrist with palpation, full ROM of thumb and wrist; strength equal bilaterally; gross sensation intact; cap refill < 2 seconds; no swelling or redness; no pain with thumb opposition; strong radial pulse; negative finkelstein   Skin:    General: Skin is warm and dry.  Neurological:     Mental Status: He is alert and oriented to person, place, and time.      UC Treatments / Results  Labs (all labs ordered are listed, but only abnormal results are displayed) Labs Reviewed - No data to display  EKG   Radiology No results found.  Procedures Procedures (including critical care time)  Medications Ordered in UC Medications - No data to display  Initial Impression / Assessment and Plan / UC Course  I have reviewed the triage vital signs and the nursing notes.   Pertinent labs & imaging results that were available during my care of the patient were reviewed by me and considered in my medical decision making (see chart for details).     Tendonitis likely with onset of new repetitive work with right hand. Nsaids, brace provided. Follow up with PCP as needed. Patient verbalized understanding and agreeable to plan.   Final Clinical Impressions(s) / UC Diagnoses   Final diagnoses:  Thumb tendonitis     Discharge Instructions     Ice of the affected area at the end of the day.  Use of brace while active to provide support to the hand and thumb.  Wean out of it as able.  Naproxen twice a day, take with food.  Follow up with you primary care provider for any persistent symptoms.     ED Prescriptions    Medication Sig Dispense Auth. Provider   naproxen (NAPROSYN) 375 MG tablet Take 1 tablet (375 mg total) by mouth 2 (two) times daily. 20 tablet Zigmund Gottron, NP     Controlled Substance Prescriptions [place] Controlled Substance Registry consulted? Not Applicable   Zigmund Gottron, NP 02/07/19 1627

## 2019-02-07 NOTE — ED Triage Notes (Signed)
Pt here for right hand pain; denies obvious injury but requests note for work

## 2019-03-05 ENCOUNTER — Other Ambulatory Visit: Payer: Self-pay

## 2019-03-05 ENCOUNTER — Emergency Department (HOSPITAL_COMMUNITY)
Admission: EM | Admit: 2019-03-05 | Discharge: 2019-03-05 | Disposition: A | Discharge status: Home / Self Care | Payer: Self-pay | Attending: Emergency Medicine | Admitting: Emergency Medicine

## 2019-03-05 ENCOUNTER — Encounter (HOSPITAL_COMMUNITY): Payer: Self-pay | Admitting: *Deleted

## 2019-03-05 DIAGNOSIS — Z79899 Other long term (current) drug therapy: Secondary | ICD-10-CM | POA: Insufficient documentation

## 2019-03-05 DIAGNOSIS — F10920 Alcohol use, unspecified with intoxication, uncomplicated: Secondary | ICD-10-CM

## 2019-03-05 DIAGNOSIS — F17210 Nicotine dependence, cigarettes, uncomplicated: Secondary | ICD-10-CM | POA: Insufficient documentation

## 2019-03-05 DIAGNOSIS — F10929 Alcohol use, unspecified with intoxication, unspecified: Secondary | ICD-10-CM | POA: Insufficient documentation

## 2019-03-05 NOTE — ED Provider Notes (Signed)
The patient is now awake, has ambulated to the bathroom and tolerated fluids.  Stable for discharge with sober ride, nursing to call for sober ride   Noemi Chapel, MD 03/05/19 [phone]

## 2019-03-05 NOTE — ED Notes (Signed)
Pt wakes easily and is somewhat agitated.   States he does not remember how he got here, who called the ambulance etc.    Cannot remember his brother's phone # at this time.  Is able to stand, dress himself and drink fluids.

## 2019-03-05 NOTE — ED Provider Notes (Signed)
[facility] EMERGENCY DEPARTMENT Provider Note   CSN: [id number] Arrival date & time: 03/05/19  0450     History   Chief Complaint Chief Complaint  Patient presents with  . Alcohol Intoxication    HPI Allen Rivas is a 23 y.o. male.     Brother states he 'drank a lot' throughout the night to EMS. He then became increasingly more sleepy and sat then laid on the ground and became unresponsive. Normal vitals with EMS. Wouldn't allow NPA, saturating well on room air.   The history is provided by the EMS personnel.  Alcohol Intoxication This is a recurrent problem. The problem occurs constantly. The problem has not changed since onset.Nothing aggravates the symptoms. Nothing relieves the symptoms. He has tried nothing for the symptoms. The treatment provided no relief.    History reviewed. No pertinent past medical history.  Patient Active Problem List   Diagnosis Date Noted  . Loss or death of parent 03/10/16    Past Surgical History:  Procedure Laterality Date  . UVULECTOMY          Home Medications    Prior to Admission medications   Medication Sig Start Date End Date Taking? Authorizing Provider  naproxen (NAPROSYN) 375 MG tablet Take 1 tablet (375 mg total) by mouth 2 (two) times daily. 02/07/19   Zigmund Gottron, NP  omeprazole (PRILOSEC) 20 MG capsule Take 1 capsule (20 mg total) by mouth daily. 10/24/18   Vanessa Kick, MD    Family History Family History  Family history unknown: Yes    Social History Social History   Tobacco Use  . Smoking status: Current Every Day Smoker    Packs/day: 0.15    Types: Cigarettes  . Smokeless tobacco: Never Used  Substance Use Topics  . Alcohol use: No  . Drug use: No     Allergies   Patient has no known allergies.   Review of Systems Review of Systems  Unable to perform ROS: Mental status change     Physical Exam Updated Vital Signs BP 114/70   Pulse 79   Temp (!) 97.3 F (36.3  C)   Resp 16   SpO2 94%   Physical Exam Vitals signs and nursing note reviewed.  Constitutional:      General: He is not in acute distress.    Appearance: He is not ill-appearing.  HENT:     Head: Normocephalic and atraumatic.     Nose: Nose normal. No congestion or rhinorrhea.     Mouth/Throat:     Mouth: Mucous membranes are dry.     Pharynx: Oropharynx is clear.  Eyes:     Extraocular Movements: Extraocular movements intact.     Conjunctiva/sclera: Conjunctivae normal.     Pupils: Pupils are equal, round, and reactive to light.  Neck:     Musculoskeletal: Normal range of motion.  Cardiovascular:     Rate and Rhythm: Normal rate.  Pulmonary:     Effort: Pulmonary effort is normal.  Musculoskeletal:        General: No swelling or tenderness.  Skin:    General: Skin is warm and dry.  Neurological:     Cranial Nerves: No cranial nerve deficit.     Motor: No weakness.     Comments: Opens eyes and withdraws to pain.      ED Treatments / Results  Labs (all labs ordered are listed, but only abnormal results are displayed) Labs Reviewed - No data to display  EKG None  Radiology No results found.  Procedures Procedures (including critical care time)  Medications Ordered in ED Medications - No data to display   Initial Impression / Assessment and Plan / ED Course  I have reviewed the triage vital signs and the nursing notes.  Pertinent labs & imaging results that were available during my care of the patient were reviewed by me and considered in my medical decision making (see chart for details).  Likely intoxicated per brother's history. No evidence of trauma at this time. no obvious neuro deficits to suggest central etiology. After reviewing the records, he has had similar presentations in past. Will allow to metabolize and dispo/workup accordingly.   Reevaluated and patient has moved position and will still withdraw to pain. Will continue to observe for  clinical sobriety.   Care transferred pending reevaluation for clinical sobriety.  Final Clinical Impressions(s) / ED Diagnoses   Final diagnoses:  None    ED Discharge Orders    None       Numan Zylstra, Corene Cornea, MD 03/05/19 [phone]

## 2019-03-05 NOTE — ED Notes (Signed)
Pt awake and using urinal at bedside.  Dropped urinal and contaminated the floor to the curtain in the room.   EVs called and pt returns to bed in fetal position to sleep again.

## 2019-03-05 NOTE — ED Triage Notes (Signed)
Pt from home for ams.. Brother reported pt has been binge drinking for the past couple of days; unsure of drug use tonight. EMS reported pinpoint pupils, unresponsive but unwilling to let medic place NPA. On arrival pt responsive to painful stimuli, pupils 5 and reactive. Bilateral 18g IV in place

## 2019-03-05 NOTE — ED Notes (Signed)
Pt alert and and talkative, will call taxi for ride.

## 2019-03-05 NOTE — Discharge Instructions (Signed)
Return to the emergency department as needed for any severe or worsening symptoms, please rest and drink plenty of fluids today.

## 2019-03-14 ENCOUNTER — Ambulatory Visit (HOSPITAL_COMMUNITY)
Admission: EM | Admit: 2019-03-14 | Discharge: 2019-03-14 | Disposition: A | Discharge status: Home / Self Care | Payer: Self-pay | Attending: Family Medicine | Admitting: Family Medicine

## 2019-03-14 ENCOUNTER — Ambulatory Visit (INDEPENDENT_AMBULATORY_CARE_PROVIDER_SITE_OTHER): Payer: Self-pay

## 2019-03-14 ENCOUNTER — Encounter (HOSPITAL_COMMUNITY): Payer: Self-pay | Admitting: Emergency Medicine

## 2019-03-14 ENCOUNTER — Other Ambulatory Visit: Payer: Self-pay

## 2019-03-14 DIAGNOSIS — K5900 Constipation, unspecified: Secondary | ICD-10-CM

## 2019-03-14 LAB — POCT URINALYSIS DIP (DEVICE)
Bilirubin Urine: NEGATIVE
Glucose, UA: NEGATIVE mg/dL
Hgb urine dipstick: NEGATIVE
Ketones, ur: NEGATIVE mg/dL
Leukocytes,Ua: NEGATIVE
Nitrite: NEGATIVE
Protein, ur: NEGATIVE mg/dL
Specific Gravity, Urine: 1.01 (ref 1.005–1.030)
Urobilinogen, UA: 0.2 mg/dL (ref 0.0–1.0)
pH: 5.5 (ref 5.0–8.0)

## 2019-03-14 MED ORDER — POLYETHYLENE GLYCOL 3350 17 GM/SCOOP PO POWD: ORAL | 0 refills | Status: DC

## 2019-03-14 NOTE — Discharge Instructions (Addendum)
Please return or go to the emergency room if pain worsens.

## 2019-03-14 NOTE — ED Triage Notes (Signed)
Pt presents to [facility] for assessment of lower abdominal pain and bilateral flank pain since Monday, and states he was sent home from work Monday and Tuesday, and was told to come get checked out.  C/o intermittent nausea, denies emesis, denies diarrhea.  Patient c/o a reddish/orangeish tint to his urine, but denies any other urinary symptoms.

## 2019-03-14 NOTE — ED Provider Notes (Signed)
[place]    CSN: [id number] Arrival date & time: 03/14/19  1247      History   Chief Complaint Chief Complaint  Patient presents with  . Abdominal Pain    HPI Allen Rivas is a 23 y.o. male.   Established [facility] patient  Pt presents to [facility] for assessment of lower abdominal pain and bilateral flank pain since Monday, and states he was sent home from work Monday and Tuesday, and was told to come get checked out.  C/o intermittent nausea, denies emesis, denies diarrhea.  Patient c/o a reddish/orangeish tint to his urine, but denies any other urinary symptoms.    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  He has never had this kind of discomfort for 4.     History reviewed. No pertinent past medical history.  Patient Active Problem List   Diagnosis Date Noted  . Loss or death of parent 03-21-2016    Past Surgical History:  Procedure Laterality Date  . UVULECTOMY         Home Medications    Prior to Admission medications   Medication Sig Start Date End Date Taking? Authorizing Provider  polyethylene glycol powder (MIRALAX) 17 GM/SCOOP powder One scoop in glass of water twice daily 03/14/19   Robyn Haber, MD  omeprazole (PRILOSEC) 20 MG capsule Take 1 capsule (20 mg total) by mouth daily. 10/24/18 03/14/19  Vanessa Kick, MD    Family History Family History  Family history unknown: Yes    Social History Social History   Tobacco Use  . Smoking status: Current Every Day Smoker    Packs/day: 0.15    Types: Cigarettes  . Smokeless tobacco: Never Used  Substance Use Topics  . Alcohol use: No  . Drug use: No     Allergies   Patient has no known allergies.   Review of Systems Review of Systems   Physical Exam Triage Vital Signs ED Triage Vitals [03/14/19 1324]  Enc Vitals Group     BP 126/75     Pulse Rate 64     Resp 16     Temp 98.4 F (36.9 C)     Temp Source Tympanic     SpO2 99 %     Weight      Height       Head Circumference      Peak Flow      Pain Score 8     Pain Loc      Pain Edu?      Excl. in [place]?    No data found.  Updated Vital Signs BP 126/75 (BP Location: Right Arm)   Pulse 64   Temp 98.4 F (36.9 C) (Tympanic)   Resp 16   SpO2 99%    Physical Exam Vitals signs and nursing note reviewed.  Constitutional:      Appearance: He is well-developed and normal weight.  HENT:     Head: Normocephalic.  Cardiovascular:     Rate and Rhythm: Normal rate.  Pulmonary:     Effort: Pulmonary effort is normal.  Abdominal:     General: Abdomen is flat. Bowel sounds are normal.     Palpations: Abdomen is rigid.     Tenderness: There is generalized abdominal tenderness. There is guarding and rebound.  Skin:    General: Skin is warm and dry.  Neurological:     General: No focal deficit present.     Mental Status: He is  alert and oriented to person, place, and time.  Psychiatric:        Mood and Affect: Mood normal.        Behavior: Behavior normal.      UC Treatments / Results  Labs (all labs ordered are listed, but only abnormal results are displayed) Labs Reviewed  POCT URINALYSIS DIP (DEVICE)    EKG   Radiology Dg Abd Acute W/chest  Result Date: 03/14/2019 CLINICAL DATA:  Abdominal pain, dizziness, and fatigue x 3 days. No n/v or diarrhea. No hx of high blood pressure, diabetes, or asthma. Smoker- 3 cigarettes per day. Per provider:diffuse abdominal pain, worsening, x 3 days. EXAM: DG ABDOMEN ACUTE W/ 1V CHEST COMPARISON:  None. FINDINGS: There is no evidence of dilated bowel loops or free intraperitoneal air. No radiopaque calculi or other significant radiographic abnormality is seen. Heart size and mediastinal contours are within normal limits. The lungs are clear. No pneumothorax or pleural effusion. The visualized skeleton is unremarkable. IMPRESSION: Nonobstructive bowel gas pattern.  No acute cardiopulmonary disease. Electronically Signed   By: Audie Pinto M.D.   On: 03/14/2019 14:43    Procedures Procedures (including critical care time)  Medications Ordered in UC Medications - No data to display  Initial Impression / Assessment and Plan / UC Course  I have reviewed the triage vital signs and the nursing notes.  Pertinent labs & imaging results that were available during my care of the patient were reviewed by me and considered in my medical decision making (see chart for details).    Final Clinical Impressions(s) / UC Diagnoses   Final diagnoses:  Constipation, unspecified constipation type     Discharge Instructions     Please return or go to the emergency room if pain worsens.    ED Prescriptions    Medication Sig Dispense Auth. Provider   polyethylene glycol powder (MIRALAX) 17 GM/SCOOP powder One scoop in glass of water twice daily 255 g Robyn Haber, MD     I have reviewed the PDMP during this encounter.   Robyn Haber, MD 03/14/19 1454

## 2019-06-18 ENCOUNTER — Emergency Department (HOSPITAL_COMMUNITY)
Admission: EM | Admit: 2019-06-18 | Discharge: 2019-06-18 | Discharge status: Against Medical Advice | Payer: Self-pay | Attending: Emergency Medicine | Admitting: Emergency Medicine

## 2019-06-18 ENCOUNTER — Encounter (HOSPITAL_COMMUNITY): Payer: Self-pay

## 2019-06-18 DIAGNOSIS — Y939 Activity, unspecified: Secondary | ICD-10-CM | POA: Insufficient documentation

## 2019-06-18 DIAGNOSIS — F1010 Alcohol abuse, uncomplicated: Secondary | ICD-10-CM

## 2019-06-18 DIAGNOSIS — Z79899 Other long term (current) drug therapy: Secondary | ICD-10-CM | POA: Insufficient documentation

## 2019-06-18 DIAGNOSIS — F10129 Alcohol abuse with intoxication, unspecified: Secondary | ICD-10-CM | POA: Insufficient documentation

## 2019-06-18 DIAGNOSIS — W19XXXA Unspecified fall, initial encounter: Secondary | ICD-10-CM | POA: Insufficient documentation

## 2019-06-18 DIAGNOSIS — Y929 Unspecified place or not applicable: Secondary | ICD-10-CM | POA: Insufficient documentation

## 2019-06-18 DIAGNOSIS — F17210 Nicotine dependence, cigarettes, uncomplicated: Secondary | ICD-10-CM | POA: Insufficient documentation

## 2019-06-18 DIAGNOSIS — Y999 Unspecified external cause status: Secondary | ICD-10-CM | POA: Insufficient documentation

## 2019-06-18 MED ORDER — ZIPRASIDONE MESYLATE 20 MG IM SOLR
20.0000 mg | Freq: Once | INTRAMUSCULAR | Status: DC
  Filled 2019-06-18: qty 20

## 2019-06-18 NOTE — ED Provider Notes (Signed)
[place] EMERGENCY DEPARTMENT Provider Note   CSN: [id number] Arrival date & time: 06/18/19  0215     History Chief Complaint  Patient presents with  . Alcohol Intoxication  . Fall    Divon Teagle is a 23 y.o. male.  Patient presents to the emergency department with a chief complaint of fall.  Reportedly he was in [employer] custody, when he passed out.  This was not witnessed.  On my exam, the patient is yelling, saying that he does not want to be examined, and wants to leave.  He is refusing care.  He refuses to give any history or allow me to perform any physical exam.  The history is provided by the patient. No language interpreter was used.       History reviewed. No pertinent past medical history.  Patient Active Problem List   Diagnosis Date Noted  . Loss or death of parent 2016/03/18    Past Surgical History:  Procedure Laterality Date  . UVULECTOMY         Family History  Family history unknown: Yes    Social History   Tobacco Use  . Smoking status: Current Every Day Smoker    Packs/day: 0.15    Types: Cigarettes  . Smokeless tobacco: Never Used  Substance Use Topics  . Alcohol use: No  . Drug use: No    Home Medications Prior to Admission medications   Medication Sig Start Date End Date Taking? Authorizing Provider  polyethylene glycol powder (MIRALAX) 17 GM/SCOOP powder One scoop in glass of water twice daily 03/14/19   Robyn Haber, MD  omeprazole (PRILOSEC) 20 MG capsule Take 1 capsule (20 mg total) by mouth daily. 10/24/18 03/14/19  Vanessa Kick, MD    Allergies    Patient has no known allergies.  Review of Systems   Review of Systems  Unable to perform ROS: Other (Unwilling to participate)    Physical Exam Updated Vital Signs BP (!) 143/103 (BP Location: Right Arm)   Pulse 73   Temp 98 F (36.7 C) (Oral)   Resp 13   SpO2 98%   Physical Exam Vitals and nursing note reviewed.  Constitutional:      General:  He is not in acute distress.    Appearance: He is well-developed. He is not ill-appearing.  HENT:     Head: Normocephalic and atraumatic.  Eyes:     Conjunctiva/sclera: Conjunctivae normal.  Cardiovascular:     Rate and Rhythm: Normal rate.  Pulmonary:     Effort: Pulmonary effort is normal. No respiratory distress.  Abdominal:     General: There is no distension.  Musculoskeletal:     Cervical back: Neck supple.     Comments: Moves all extremities  Skin:    General: Skin is warm and dry.  Neurological:     Mental Status: He is alert and oriented to person, place, and time.  Psychiatric:     Comments: Angry     ED Results / Procedures / Treatments   Labs (all labs ordered are listed, but only abnormal results are displayed) Labs Reviewed  COMPREHENSIVE METABOLIC PANEL  SALICYLATE LEVEL  ACETAMINOPHEN LEVEL  ETHANOL  RAPID URINE DRUG SCREEN, HOSP PERFORMED  CBC WITH DIFFERENTIAL/PLATELET  CBG MONITORING, ED    EKG None  Radiology No results found.  Procedures Procedures (including critical care time)  Medications Ordered in ED Medications  ziprasidone (GEODON) injection 20 mg (has no administration in time range)  ED Course  I have reviewed the triage vital signs and the nursing notes.  Pertinent labs & imaging results that were available during my care of the patient were reviewed by me and considered in my medical decision making (see chart for details).    MDM Rules/Calculators/A&P                      Patient reportedly passed out while in [employer] custody.  This was not witnessed.  EMS reports concern that he may have been faking his symptoms to avoid arrest.  When I arrived in the exam room, was sitting up in bed, climbed out of the bed and removed all of his monitors and IV.  He refuses treatment.  [employer] states he is not in [employer] custody.  He does respond to my questions [employer].  Although he is angry with police, he is able to comprehend what police and  hospital staff are telling him.  Offered care.  This was refused.  He seems to have capacity to make his own decisions.  There is no evidence of traumatic injury on my limited exam.  Patient walked out without treatment.     Final Clinical Impression(s) / ED Diagnoses Final diagnoses:  ETOH abuse    Rx / DC Orders ED Discharge Orders    None       Montine Circle, PA-C 06/18/19 0245    Veryl Speak, MD 06/18/19 [phone]

## 2019-06-18 NOTE — ED Triage Notes (Signed)
Pt BIB [facility] in police custody from jail. Pt was acting aggressive at jail and several police officers performed takedown with no injuries resulting except for pt biting bottom lip.  Afterwards pt was sitting and then became unresponsive and fell over to side. No injuries noted from fall.  Pt pupils are reactive and responsive to light. Reacts to visual threat as well. Still not verbal.  VSS with EMS besides BP (148/100) CBG: 95  C-Collar in place.   No medical Hx obtained from EMS or police.

## 2019-09-14 ENCOUNTER — Emergency Department (HOSPITAL_COMMUNITY)
Admission: EM | Admit: 2019-09-14 | Discharge: 2019-09-14 | Disposition: A | Discharge status: Home / Self Care | Payer: Self-pay | Attending: Emergency Medicine | Admitting: Emergency Medicine

## 2019-09-14 ENCOUNTER — Emergency Department (HOSPITAL_COMMUNITY): Payer: Self-pay

## 2019-09-14 DIAGNOSIS — Y9389 Activity, other specified: Secondary | ICD-10-CM | POA: Insufficient documentation

## 2019-09-14 DIAGNOSIS — Y999 Unspecified external cause status: Secondary | ICD-10-CM | POA: Insufficient documentation

## 2019-09-14 DIAGNOSIS — F10920 Alcohol use, unspecified with intoxication, uncomplicated: Secondary | ICD-10-CM

## 2019-09-14 DIAGNOSIS — S01111A Laceration without foreign body of right eyelid and periocular area, initial encounter: Secondary | ICD-10-CM | POA: Insufficient documentation

## 2019-09-14 DIAGNOSIS — Y92410 Unspecified street and highway as the place of occurrence of the external cause: Secondary | ICD-10-CM | POA: Insufficient documentation

## 2019-09-14 DIAGNOSIS — R4182 Altered mental status, unspecified: Secondary | ICD-10-CM | POA: Insufficient documentation

## 2019-09-14 DIAGNOSIS — R451 Restlessness and agitation: Secondary | ICD-10-CM | POA: Insufficient documentation

## 2019-09-14 DIAGNOSIS — F17210 Nicotine dependence, cigarettes, uncomplicated: Secondary | ICD-10-CM | POA: Insufficient documentation

## 2019-09-14 DIAGNOSIS — Y908 Blood alcohol level of 240 mg/100 ml or more: Secondary | ICD-10-CM | POA: Insufficient documentation

## 2019-09-14 DIAGNOSIS — F10929 Alcohol use, unspecified with intoxication, unspecified: Secondary | ICD-10-CM | POA: Insufficient documentation

## 2019-09-14 LAB — COMPREHENSIVE METABOLIC PANEL
ALT: 17 U/L (ref 0–44)
AST: 34 U/L (ref 15–41)
Albumin: 4.5 g/dL (ref 3.5–5.0)
Alkaline Phosphatase: 40 U/L (ref 38–126)
Anion gap: 12 (ref 5–15)
BUN: <5 mg/dL — ABNORMAL LOW (ref 6–20)
CO2: 21 mmol/L — ABNORMAL LOW (ref 22–32)
Calcium: 8.9 mg/dL (ref 8.9–10.3)
Chloride: 107 mmol/L (ref 98–111)
Creatinine, Ser: 0.78 mg/dL (ref 0.61–1.24)
GFR calc Af Amer: >60 mL/min (ref >60)
GFR calc non Af Amer: >60 mL/min (ref >60)
Glucose, Bld: 104 mg/dL — ABNORMAL HIGH (ref 70–99)
Potassium: 4 mmol/L (ref 3.5–5.1)
Sodium: 140 mmol/L (ref 135–145)
Total Bilirubin: 0.5 mg/dL (ref 0.3–1.2)
Total Protein: 7.4 g/dL (ref 6.5–8.1)

## 2019-09-14 LAB — LACTIC ACID, PLASMA: Lactic Acid, Venous: 2.2 mmol/L (ref 0.5–1.9)

## 2019-09-14 LAB — RAPID URINE DRUG SCREEN, HOSP PERFORMED
Amphetamines: NOT DETECTED
Barbiturates: NOT DETECTED
Benzodiazepines: POSITIVE — AB
Cocaine: NOT DETECTED
Opiates: NOT DETECTED
Tetrahydrocannabinol: NOT DETECTED

## 2019-09-14 LAB — SAMPLE TO BLOOD BANK

## 2019-09-14 LAB — I-STAT CHEM 8, ED
BUN: 4 mg/dL — ABNORMAL LOW (ref 6–20)
Calcium, Ion: 1.06 mmol/L — ABNORMAL LOW (ref 1.15–1.40)
Chloride: 106 mmol/L (ref 98–111)
Creatinine, Ser: 1.1 mg/dL (ref 0.61–1.24)
Glucose, Bld: 101 mg/dL — ABNORMAL HIGH (ref 70–99)
HCT: 50 % (ref 39.0–52.0)
Hemoglobin: 17 g/dL (ref 13.0–17.0)
Potassium: 3.9 mmol/L (ref 3.5–5.1)
Sodium: 141 mmol/L (ref 135–145)
TCO2: 22 mmol/L (ref 22–32)

## 2019-09-14 LAB — URINALYSIS, ROUTINE W REFLEX MICROSCOPIC
Bilirubin Urine: NEGATIVE
Glucose, UA: NEGATIVE mg/dL
Hgb urine dipstick: NEGATIVE
Ketones, ur: NEGATIVE mg/dL
Leukocytes,Ua: NEGATIVE
Nitrite: NEGATIVE
Protein, ur: NEGATIVE mg/dL
Specific Gravity, Urine: 1.002 — ABNORMAL LOW (ref 1.005–1.030)
pH: 5 (ref 5.0–8.0)

## 2019-09-14 LAB — CBC
HCT: 47.4 % (ref 39.0–52.0)
Hemoglobin: 17.4 g/dL — ABNORMAL HIGH (ref 13.0–17.0)
MCH: 32.3 pg (ref 26.0–34.0)
MCHC: 36.7 g/dL — ABNORMAL HIGH (ref 30.0–36.0)
MCV: 88.1 fL (ref 80.0–100.0)
Platelets: 198 10*3/uL (ref 150–400)
RBC: 5.38 MIL/uL (ref 4.22–5.81)
RDW: 11.7 % (ref 11.5–15.5)
WBC: 4.9 10*3/uL (ref 4.0–10.5)
nRBC: 0 % (ref 0.0–0.2)

## 2019-09-14 LAB — PROTIME-INR
INR: 1 (ref 0.8–1.2)
Prothrombin Time: 13.1 seconds (ref 11.4–15.2)

## 2019-09-14 LAB — ETHANOL: Alcohol, Ethyl (B): 310 mg/dL (ref <10)

## 2019-09-14 MED ORDER — IOHEXOL 300 MG/ML  SOLN
80.0000 mL | Freq: Once | INTRAMUSCULAR | Status: AC | PRN
  Administered 2019-09-14: 80 mL via INTRAVENOUS

## 2019-09-14 NOTE — ED Triage Notes (Signed)
Pt arrives via EMS with [employer] with complaint of ETOH intoxication and a head laceration over his right eyebrow. Pt was combative and EMS administered 5 haldol and 5 versed.

## 2019-09-14 NOTE — ED Notes (Signed)
Discharge instructions reviewed with pt. Pt verbalized understanding.   

## 2019-09-14 NOTE — Discharge Instructions (Addendum)
Incidentally on your CT scan there was noted to have a mass in the left upper quadrant of your abdomen.  The radiologist recommended a follow-up study MRI done as an outpatient.  Please discuss this with your family physician.  Drinking can be hazardous to your health.  Please try and cut back on your alcohol intake especially if you are considering driving a motor vehicle.

## 2019-09-14 NOTE — ED Notes (Signed)
Pt ambulated with assistance. Pt noted to have a steady gait and offered no complaints. Pt verbalized understanding of safety requirements. RN removed restraints as pt is being cooperative with staff at this time.

## 2019-09-14 NOTE — ED Notes (Signed)
Dr. Tyrone Nine notified of pt lactic acid and ETOH level.
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
## 2020-03-22 ENCOUNTER — Encounter (HOSPITAL_COMMUNITY): Admission: EM | Disposition: A | Discharge status: Home / Self Care | Payer: Self-pay | Source: Home / Self Care

## 2020-03-22 ENCOUNTER — Emergency Department (HOSPITAL_COMMUNITY): Payer: Self-pay | Admitting: Anesthesiology

## 2020-03-22 ENCOUNTER — Emergency Department (HOSPITAL_COMMUNITY): Payer: Self-pay

## 2020-03-22 ENCOUNTER — Inpatient Hospital Stay (HOSPITAL_COMMUNITY)
Admission: EM | Admit: 2020-03-22 | Discharge: 2020-03-27 | DRG: 329 | Disposition: A | Discharge status: Home / Self Care | Payer: Self-pay | Attending: General Surgery | Admitting: General Surgery

## 2020-03-22 DIAGNOSIS — Y908 Blood alcohol level of 240 mg/100 ml or more: Secondary | ICD-10-CM | POA: Diagnosis present

## 2020-03-22 DIAGNOSIS — K668 Other specified disorders of peritoneum: Secondary | ICD-10-CM

## 2020-03-22 DIAGNOSIS — T1490XA Injury, unspecified, initial encounter: Secondary | ICD-10-CM | POA: Diagnosis present

## 2020-03-22 DIAGNOSIS — F10920 Alcohol use, unspecified with intoxication, uncomplicated: Secondary | ICD-10-CM

## 2020-03-22 DIAGNOSIS — F10120 Alcohol abuse with intoxication, uncomplicated: Secondary | ICD-10-CM | POA: Diagnosis present

## 2020-03-22 DIAGNOSIS — K661 Hemoperitoneum: Principal | ICD-10-CM | POA: Diagnosis present

## 2020-03-22 DIAGNOSIS — R404 Transient alteration of awareness: Secondary | ICD-10-CM | POA: Diagnosis present

## 2020-03-22 DIAGNOSIS — D490 Neoplasm of unspecified behavior of digestive system: Secondary | ICD-10-CM | POA: Diagnosis present

## 2020-03-22 DIAGNOSIS — Y92410 Unspecified street and highway as the place of occurrence of the external cause: Secondary | ICD-10-CM

## 2020-03-22 DIAGNOSIS — L7622 Postprocedural hemorrhage and hematoma of skin and subcutaneous tissue following other procedure: Secondary | ICD-10-CM | POA: Diagnosis not present

## 2020-03-22 DIAGNOSIS — E876 Hypokalemia: Secondary | ICD-10-CM | POA: Diagnosis not present

## 2020-03-22 DIAGNOSIS — D62 Acute posthemorrhagic anemia: Secondary | ICD-10-CM | POA: Diagnosis not present

## 2020-03-22 DIAGNOSIS — S01511A Laceration without foreign body of lip, initial encounter: Secondary | ICD-10-CM | POA: Diagnosis present

## 2020-03-22 DIAGNOSIS — Z20822 Contact with and (suspected) exposure to covid-19: Secondary | ICD-10-CM | POA: Diagnosis present

## 2020-03-22 DIAGNOSIS — Z9289 Personal history of other medical treatment: Secondary | ICD-10-CM

## 2020-03-22 DIAGNOSIS — S36408A Unspecified injury of other part of small intestine, initial encounter: Secondary | ICD-10-CM | POA: Diagnosis present

## 2020-03-22 DIAGNOSIS — K631 Perforation of intestine (nontraumatic): Secondary | ICD-10-CM | POA: Diagnosis present

## 2020-03-22 HISTORY — PX: LAPAROTOMY: SHX154

## 2020-03-22 LAB — CBC WITH DIFFERENTIAL/PLATELET
Abs Immature Granulocytes: 0.07 10*3/uL (ref 0.00–0.07)
Basophils Absolute: 0 10*3/uL (ref 0.0–0.1)
Basophils Relative: 0 %
Eosinophils Absolute: 0 10*3/uL (ref 0.0–0.5)
Eosinophils Relative: 0 %
HCT: 48.7 % (ref 39.0–52.0)
Hemoglobin: 17.1 g/dL — ABNORMAL HIGH (ref 13.0–17.0)
Immature Granulocytes: 1 %
Lymphocytes Relative: 19 %
Lymphs Abs: 2.6 10*3/uL (ref 0.7–4.0)
MCH: 30.3 pg (ref 26.0–34.0)
MCHC: 35.1 g/dL (ref 30.0–36.0)
MCV: 86.3 fL (ref 80.0–100.0)
Monocytes Absolute: 1.1 10*3/uL — ABNORMAL HIGH (ref 0.1–1.0)
Monocytes Relative: 8 %
Neutro Abs: 9.9 10*3/uL — ABNORMAL HIGH (ref 1.7–7.7)
Neutrophils Relative %: 72 %
Platelets: 240 10*3/uL (ref 150–400)
RBC: 5.64 MIL/uL (ref 4.22–5.81)
RDW: 12.3 % (ref 11.5–15.5)
WBC: 13.7 10*3/uL — ABNORMAL HIGH (ref 4.0–10.5)
nRBC: 0 % (ref 0.0–0.2)

## 2020-03-22 LAB — PROTIME-INR
INR: 1 (ref 0.8–1.2)
Prothrombin Time: 12.5 seconds (ref 11.4–15.2)

## 2020-03-22 LAB — SAMPLE TO BLOOD BANK

## 2020-03-22 LAB — ETHANOL: Alcohol, Ethyl (B): 361 mg/dL (ref <10)

## 2020-03-22 SURGERY — LAPAROTOMY, EXPLORATORY
Anesthesia: General | Site: Abdomen

## 2020-03-22 MED ORDER — FENTANYL CITRATE (PF) 100 MCG/2ML IJ SOLN
INTRAMUSCULAR | Status: DC | PRN
Start: 2020-03-22 — End: 2020-03-23
  Administered 2020-03-22 – 2020-03-23 (×5): 50 ug via INTRAVENOUS

## 2020-03-22 MED ORDER — CEFAZOLIN SODIUM-DEXTROSE 2-4 GM/100ML-% IV SOLN
2.0000 g | Freq: Once | INTRAVENOUS | Status: AC
  Administered 2020-03-22: 2 g via INTRAVENOUS

## 2020-03-22 MED ORDER — PROPOFOL 1000 MG/100ML IV EMUL
INTRAVENOUS | Status: AC
  Administered 2020-03-22: 12 ug/kg/min
  Filled 2020-03-22: qty 100

## 2020-03-22 MED ORDER — FENTANYL CITRATE (PF) 250 MCG/5ML IJ SOLN
INTRAMUSCULAR | Status: AC
  Filled 2020-03-22: qty 5

## 2020-03-22 MED ORDER — MIDAZOLAM HCL 2 MG/2ML IJ SOLN
INTRAMUSCULAR | Status: AC
  Filled 2020-03-22: qty 2

## 2020-03-22 MED ORDER — FENTANYL CITRATE (PF) 100 MCG/2ML IJ SOLN
INTRAMUSCULAR | Status: AC
  Filled 2020-03-22: qty 2

## 2020-03-22 MED ORDER — LACTATED RINGERS IV SOLN: INTRAVENOUS | Status: DC | PRN

## 2020-03-22 MED ORDER — ETOMIDATE 2 MG/ML IV SOLN
INTRAVENOUS | Status: AC | PRN
  Administered 2020-03-22: 20 mg via INTRAVENOUS

## 2020-03-22 MED ORDER — IOHEXOL 300 MG/ML  SOLN
100.0000 mL | Freq: Once | INTRAMUSCULAR | Status: AC | PRN
  Administered 2020-03-22: 100 mL via INTRAVENOUS

## 2020-03-22 MED ORDER — ROCURONIUM BROMIDE 50 MG/5ML IV SOLN
INTRAVENOUS | Status: AC | PRN
  Administered 2020-03-22: 100 mg via INTRAVENOUS

## 2020-03-22 MED ORDER — ROCURONIUM BROMIDE 10 MG/ML (PF) SYRINGE
PREFILLED_SYRINGE | INTRAVENOUS | Status: DC | PRN
  Administered 2020-03-22: 50 mg via INTRAVENOUS
  Administered 2020-03-23: 20 mg via INTRAVENOUS

## 2020-03-22 SURGICAL SUPPLY — 44 items
BLADE CLIPPER SURG (BLADE) ×3 IMPLANT
CANISTER SUCT 3000ML PPV (MISCELLANEOUS) ×3 IMPLANT
CHLORAPREP W/TINT 26 (MISCELLANEOUS) ×3 IMPLANT
CNTNR URN SCR LID CUP LEK RST (MISCELLANEOUS) ×2 IMPLANT
CONT SPEC 4OZ STRL OR WHT (MISCELLANEOUS) ×4
COVER SURGICAL LIGHT HANDLE (MISCELLANEOUS) ×3 IMPLANT
DRAPE LAPAROSCOPIC ABDOMINAL (DRAPES) ×3 IMPLANT
DRAPE WARM FLUID 44X44 (DRAPES) ×3 IMPLANT
DRSG OPSITE POSTOP 4X10 (GAUZE/BANDAGES/DRESSINGS) IMPLANT
DRSG OPSITE POSTOP 4X8 (GAUZE/BANDAGES/DRESSINGS) ×3 IMPLANT
DURAPREP 26ML APPLICATOR (WOUND CARE) ×3 IMPLANT
ELECT BLADE 6.5 EXT (BLADE) ×3 IMPLANT
ELECT REM PT RETURN 9FT ADLT (ELECTROSURGICAL) ×3
ELECTRODE REM PT RTRN 9FT ADLT (ELECTROSURGICAL) ×1 IMPLANT
GLOVE BIO SURGEON STRL SZ7 (GLOVE) ×3 IMPLANT
GLOVE BIOGEL PI IND STRL 7.5 (GLOVE) ×1 IMPLANT
GLOVE BIOGEL PI INDICATOR 7.5 (GLOVE) ×2
GOWN STRL REUS W/ TWL LRG LVL3 (GOWN DISPOSABLE) ×2 IMPLANT
GOWN STRL REUS W/TWL LRG LVL3 (GOWN DISPOSABLE) ×4
HANDLE SUCTION POOLE (INSTRUMENTS) ×1 IMPLANT
KIT BASIN OR (CUSTOM PROCEDURE TRAY) ×3 IMPLANT
KIT TURNOVER KIT B (KITS) ×3 IMPLANT
LIGASURE IMPACT 36 18CM CVD LR (INSTRUMENTS) ×3 IMPLANT
NS IRRIG 1000ML POUR BTL (IV SOLUTION) ×3 IMPLANT
PACK GENERAL/GYN (CUSTOM PROCEDURE TRAY) ×3 IMPLANT
PAD ARMBOARD 7.5X6 YLW CONV (MISCELLANEOUS) ×3 IMPLANT
PENCIL SMOKE EVACUATOR (MISCELLANEOUS) ×3 IMPLANT
RELOAD PROXIMATE 75MM BLUE (ENDOMECHANICALS) ×21 IMPLANT
RELOAD PROXIMATE TA60MM BLUE (ENDOMECHANICALS) ×6 IMPLANT
SPONGE LAP 18X18 RF (DISPOSABLE) ×6 IMPLANT
STAPLER GUN LINEAR PROX 60 (STAPLE) ×3 IMPLANT
STAPLER PROXIMATE 75MM BLUE (STAPLE) ×3 IMPLANT
STAPLER VISISTAT 35W (STAPLE) ×3 IMPLANT
SUCTION POOLE HANDLE (INSTRUMENTS) ×3
SUT PDS AB 1 TP1 96 (SUTURE) ×6 IMPLANT
SUT SILK 2 0 (SUTURE) ×2
SUT SILK 2 0 SH CR/8 (SUTURE) ×3 IMPLANT
SUT SILK 2-0 18XBRD TIE 12 (SUTURE) ×1 IMPLANT
SUT SILK 3 0 (SUTURE) ×2
SUT SILK 3 0 SH CR/8 (SUTURE) ×9 IMPLANT
SUT SILK 3-0 18XBRD TIE 12 (SUTURE) ×1 IMPLANT
TOWEL GREEN STERILE (TOWEL DISPOSABLE) ×3 IMPLANT
TRAY FOLEY MTR SLVR 16FR STAT (SET/KITS/TRAYS/PACK) ×3 IMPLANT
YANKAUER SUCT BULB TIP NO VENT (SUCTIONS) ×3 IMPLANT
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
## 2020-03-22 NOTE — Anesthesia Preprocedure Evaluation (Signed)
Anesthesia Evaluation  Patient identified by MRN, date of birth, ID band Patient unresponsive    Reviewed: Unable to perform ROS - Chart review onlyPreop documentation limited or incomplete due to emergent nature of procedure.  Airway Mallampati: Intubated       Dental   Pulmonary     + decreased breath sounds      Cardiovascular      Neuro/Psych    GI/Hepatic   Endo/Other    Renal/GU      Musculoskeletal   Abdominal   Peds  Hematology   Anesthesia Other Findings   Reproductive/Obstetrics                             Anesthesia Physical Anesthesia Plan  ASA: III and emergent  Anesthesia Plan: General   Post-op Pain Management:    Induction: Inhalational  PONV Risk Score and Plan: 3 and Ondansetron, Midazolam and Treatment may vary due to age or medical condition  Airway Management Planned: Oral ETT  Additional Equipment:   Intra-op Plan:   Post-operative Plan: Post-operative intubation/ventilation  Informed Consent:     History available from chart only and Only emergency history available  Plan Discussed with: CRNA  Anesthesia Plan Comments: (Possible arterial line. )        Anesthesia Quick Evaluation
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
## 2020-03-23 ENCOUNTER — Inpatient Hospital Stay (HOSPITAL_COMMUNITY): Payer: Self-pay

## 2020-03-23 LAB — COMPREHENSIVE METABOLIC PANEL
ALT: 51 U/L — ABNORMAL HIGH (ref 0–44)
AST: 144 U/L — ABNORMAL HIGH (ref 15–41)
Albumin: 4.3 g/dL (ref 3.5–5.0)
Alkaline Phosphatase: 46 U/L (ref 38–126)
Anion gap: 15 (ref 5–15)
BUN: 6 mg/dL (ref 6–20)
CO2: 14 mmol/L — ABNORMAL LOW (ref 22–32)
Calcium: 8.7 mg/dL — ABNORMAL LOW (ref 8.9–10.3)
Chloride: 106 mmol/L (ref 98–111)
Creatinine, Ser: 0.94 mg/dL (ref 0.61–1.24)
GFR calc Af Amer: 56 mL/min — ABNORMAL LOW (ref >60)
GFR calc non Af Amer: 49 mL/min — ABNORMAL LOW (ref >60)
Glucose, Bld: 108 mg/dL — ABNORMAL HIGH (ref 70–99)
Potassium: 4.9 mmol/L (ref 3.5–5.1)
Sodium: 135 mmol/L (ref 135–145)
Total Bilirubin: 1.1 mg/dL (ref 0.3–1.2)
Total Protein: 6.8 g/dL (ref 6.5–8.1)

## 2020-03-23 LAB — BPAM RBC
Blood Product Expiration Date: 202111012359
Blood Product Expiration Date: 202111012359
Blood Product Expiration Date: 202111012359
Blood Product Expiration Date: 202111012359
Blood Product Expiration Date: 202111012359
Blood Product Expiration Date: 202111012359
Blood Product Expiration Date: 202111012359
Blood Product Expiration Date: 202111012359
ISSUE DATE / TIME: 202110022324
ISSUE DATE / TIME: 202110022324
ISSUE DATE / TIME: 202110022324
ISSUE DATE / TIME: 202110022324
ISSUE DATE / TIME: 202110030004
ISSUE DATE / TIME: 202110030004
ISSUE DATE / TIME: 202110030004
ISSUE DATE / TIME: 202110030004
Unit Type and Rh: 5100
Unit Type and Rh: 5100
Unit Type and Rh: 5100
Unit Type and Rh: 5100
Unit Type and Rh: 5100
Unit Type and Rh: 5100
Unit Type and Rh: 5100
Unit Type and Rh: 5100

## 2020-03-23 LAB — POCT I-STAT 7, (LYTES, BLD GAS, ICA,H+H)
Acid-base deficit: 10 mmol/L — ABNORMAL HIGH (ref 0.0–2.0)
Acid-base deficit: 9 mmol/L — ABNORMAL HIGH (ref 0.0–2.0)
Bicarbonate: 13.5 mmol/L — ABNORMAL LOW (ref 20.0–28.0)
Bicarbonate: 13.7 mmol/L — ABNORMAL LOW (ref 20.0–28.0)
Calcium, Ion: 1.06 mmol/L — ABNORMAL LOW (ref 1.15–1.40)
Calcium, Ion: 1.07 mmol/L — ABNORMAL LOW (ref 1.15–1.40)
HCT: 49 % (ref 39.0–52.0)
HCT: 49 % (ref 39.0–52.0)
Hemoglobin: 16.7 g/dL (ref 13.0–17.0)
Hemoglobin: 16.7 g/dL (ref 13.0–17.0)
O2 Saturation: 100 %
O2 Saturation: 89 %
Patient temperature: 97.7
Patient temperature: 98.6
Potassium: 3.3 mmol/L — ABNORMAL LOW (ref 3.5–5.1)
Potassium: 3.4 mmol/L — ABNORMAL LOW (ref 3.5–5.1)
Sodium: 137 mmol/L (ref 135–145)
Sodium: 139 mmol/L (ref 135–145)
TCO2: 14 mmol/L — ABNORMAL LOW (ref 22–32)
TCO2: 14 mmol/L — ABNORMAL LOW (ref 22–32)
pCO2 arterial: 23.8 mmHg — ABNORMAL LOW (ref 32.0–48.0)
pCO2 arterial: 23.9 mmHg — ABNORMAL LOW (ref 32.0–48.0)
pH, Arterial: 7.359 (ref 7.350–7.450)
pH, Arterial: 7.366 (ref 7.350–7.450)
pO2, Arterial: 266 mmHg — ABNORMAL HIGH (ref 83.0–108.0)
pO2, Arterial: 56 mmHg — ABNORMAL LOW (ref 83.0–108.0)

## 2020-03-23 LAB — POCT I-STAT, CHEM 8
BUN: 5 mg/dL — ABNORMAL LOW (ref 6–20)
Calcium, Ion: 1.1 mmol/L — ABNORMAL LOW (ref 1.15–1.40)
Chloride: 105 mmol/L (ref 98–111)
Creatinine, Ser: 1.2 mg/dL (ref 0.61–1.24)
Glucose, Bld: 95 mg/dL (ref 70–99)
HCT: 52 % (ref 39.0–52.0)
Hemoglobin: 17.7 g/dL — ABNORMAL HIGH (ref 13.0–17.0)
Potassium: 3.6 mmol/L (ref 3.5–5.1)
Sodium: 141 mmol/L (ref 135–145)
TCO2: 19 mmol/L — ABNORMAL LOW (ref 22–32)

## 2020-03-23 LAB — TYPE AND SCREEN
ABO/RH(D): O POS
Antibody Screen: NEGATIVE
Unit division: 0
Unit division: 0
Unit division: 0
Unit division: 0
Unit division: 0
Unit division: 0
Unit division: 0
Unit division: 0

## 2020-03-23 LAB — CBC
HCT: 48 % (ref 39.0–52.0)
Hemoglobin: 16.9 g/dL (ref 13.0–17.0)
MCH: 30.4 pg (ref 26.0–34.0)
MCHC: 35.2 g/dL (ref 30.0–36.0)
MCV: 86.3 fL (ref 80.0–100.0)
Platelets: 241 10*3/uL (ref 150–400)
RBC: 5.56 MIL/uL (ref 4.22–5.81)
RDW: 12.4 % (ref 11.5–15.5)
WBC: 11.1 10*3/uL — ABNORMAL HIGH (ref 4.0–10.5)
nRBC: 0 % (ref 0.0–0.2)

## 2020-03-23 LAB — RESPIRATORY PANEL BY RT PCR (FLU A&B, COVID)
Influenza A by PCR: NEGATIVE
Influenza B by PCR: NEGATIVE
SARS Coronavirus 2 by RT PCR: NEGATIVE

## 2020-03-23 LAB — ABO/RH: ABO/RH(D): O POS

## 2020-03-23 LAB — BASIC METABOLIC PANEL
Anion gap: 17 — ABNORMAL HIGH (ref 5–15)
BUN: 5 mg/dL — ABNORMAL LOW (ref 6–20)
CO2: 13 mmol/L — ABNORMAL LOW (ref 22–32)
Calcium: 7.9 mg/dL — ABNORMAL LOW (ref 8.9–10.3)
Chloride: 108 mmol/L (ref 98–111)
Creatinine, Ser: 0.83 mg/dL (ref 0.61–1.24)
GFR calc Af Amer: >60 mL/min (ref >60)
GFR calc non Af Amer: 53 mL/min — ABNORMAL LOW (ref >60)
Glucose, Bld: 128 mg/dL — ABNORMAL HIGH (ref 70–99)
Potassium: 3.6 mmol/L (ref 3.5–5.1)
Sodium: 138 mmol/L (ref 135–145)

## 2020-03-23 LAB — MRSA PCR SCREENING: MRSA by PCR: NEGATIVE

## 2020-03-23 LAB — TRIGLYCERIDES: Triglycerides: 197 mg/dL — ABNORMAL HIGH (ref <150)

## 2020-03-23 MED ORDER — HYDROMORPHONE HCL 1 MG/ML IJ SOLN
0.5000 mg | INTRAMUSCULAR | Status: DC | PRN
  Administered 2020-03-23 – 2020-03-24 (×2): 1 mg via INTRAVENOUS
  Filled 2020-03-23 (×2): qty 1

## 2020-03-23 MED ORDER — PROPOFOL 500 MG/50ML IV EMUL
INTRAVENOUS | Status: DC | PRN
  Administered 2020-03-23: 40 ug/kg/min via INTRAVENOUS

## 2020-03-23 MED ORDER — ONDANSETRON HCL 4 MG/2ML IJ SOLN
INTRAMUSCULAR | Status: DC | PRN
  Administered 2020-03-23: 4 mg via INTRAVENOUS

## 2020-03-23 MED ORDER — ENOXAPARIN SODIUM 40 MG/0.4ML ~~LOC~~ SOLN: 40.0000 mg | SUBCUTANEOUS | Status: DC

## 2020-03-23 MED ORDER — ONDANSETRON HCL 4 MG/2ML IJ SOLN: 4.0000 mg | Freq: Four times a day (QID) | INTRAMUSCULAR | Status: DC | PRN

## 2020-03-23 MED ORDER — FOLIC ACID 5 MG/ML IJ SOLN
1.0000 mg | Freq: Every day | INTRAMUSCULAR | Status: AC
  Administered 2020-03-23 – 2020-03-25 (×3): 1 mg via INTRAVENOUS
  Filled 2020-03-23 (×5): qty 0.2

## 2020-03-23 MED ORDER — DEXTROSE-NACL 5-0.45 % IV SOLN: INTRAVENOUS | Status: DC

## 2020-03-23 MED ORDER — FENTANYL CITRATE (PF) 100 MCG/2ML IJ SOLN
50.0000 ug | INTRAMUSCULAR | Status: DC | PRN
  Administered 2020-03-23: 100 ug via INTRAVENOUS
  Filled 2020-03-23: qty 2

## 2020-03-23 MED ORDER — FENTANYL CITRATE (PF) 100 MCG/2ML IJ SOLN
50.0000 ug | INTRAMUSCULAR | Status: DC | PRN
  Administered 2020-03-23 (×2): 50 ug via INTRAVENOUS
  Filled 2020-03-23: qty 2

## 2020-03-23 MED ORDER — CHLORHEXIDINE GLUCONATE 0.12% ORAL RINSE (MEDLINE KIT)
15.0000 mL | Freq: Two times a day (BID) | OROMUCOSAL | Status: DC
  Administered 2020-03-23 (×2): 15 mL via OROMUCOSAL

## 2020-03-23 MED ORDER — KETOROLAC TROMETHAMINE 15 MG/ML IJ SOLN
15.0000 mg | Freq: Three times a day (TID) | INTRAMUSCULAR | Status: DC | PRN
  Administered 2020-03-23 – 2020-03-24 (×3): 15 mg via INTRAVENOUS
  Filled 2020-03-23 (×3): qty 1

## 2020-03-23 MED ORDER — SODIUM CHLORIDE 0.9 % IV SOLN: INTRAVENOUS | Status: DC

## 2020-03-23 MED ORDER — ONDANSETRON 4 MG PO TBDP
4.0000 mg | ORAL_TABLET | Freq: Four times a day (QID) | ORAL | Status: DC | PRN
  Filled 2020-03-23: qty 1

## 2020-03-23 MED ORDER — SODIUM CHLORIDE 0.9 % IV SOLN: 1.0000 mg | Freq: Every day | INTRAVENOUS | Status: DC

## 2020-03-23 MED ORDER — CHLORHEXIDINE GLUCONATE CLOTH 2 % EX PADS
6.0000 | MEDICATED_PAD | Freq: Every day | CUTANEOUS | Status: DC
  Administered 2020-03-25: 6 via TOPICAL

## 2020-03-23 MED ORDER — THIAMINE HCL 100 MG/ML IJ SOLN
100.0000 mg | Freq: Every day | INTRAMUSCULAR | Status: AC
  Administered 2020-03-23 – 2020-03-25 (×3): 100 mg via INTRAVENOUS
  Filled 2020-03-23 (×3): qty 2

## 2020-03-23 MED ORDER — ORAL CARE MOUTH RINSE
15.0000 mL | OROMUCOSAL | Status: DC
  Administered 2020-03-23 (×4): 15 mL via OROMUCOSAL

## 2020-03-23 MED ORDER — LORAZEPAM 2 MG/ML IJ SOLN: 0.5000 mg | INTRAMUSCULAR | Status: DC | PRN

## 2020-03-23 MED ORDER — PROPOFOL 1000 MG/100ML IV EMUL
0.0000 ug/kg/min | INTRAVENOUS | Status: DC
  Administered 2020-03-23: 30 ug/kg/min via INTRAVENOUS
  Filled 2020-03-23: qty 100

## 2020-03-23 MED ORDER — ORAL CARE MOUTH RINSE
15.0000 mL | Freq: Two times a day (BID) | OROMUCOSAL | Status: DC
  Administered 2020-03-23 – 2020-03-25 (×5): 15 mL via OROMUCOSAL

## 2020-03-23 NOTE — Plan of Care (Signed)
Pt is able to tolerate movements/repositioning in bed and has a prophylatic dressing on sacrum. Vital signs have remained stable throughout the shift, elevated HR likely due to pain/discomfort. Will continue to help manage patient pain level.
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
## 2020-03-23 NOTE — Progress Notes (Signed)
Pt self-extubated approximately 0730, RN at bedside, nonrebreather on, pt AO x4. No distress, MD notified.

## 2020-03-23 NOTE — Progress Notes (Addendum)
Pt arrived on unit 03/23/2020 0115 and is intubated, sedated, and appears stable.  Only belongings handed off from [place] was a pair of socks. Have been placed in pt belongings bag at the bedside.   Pt remains unidentified and no family has been able to be contacted at this time. Will continue to make care team aware of missing identification.   Monroe Toure GARNER

## 2020-03-23 NOTE — Progress Notes (Signed)
Pt refused to have NG placed. MD paged.
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
## 2020-03-23 NOTE — Transfer of Care (Signed)
Immediate Anesthesia Transfer of Care Note  Patient: Allen Rivas  Procedure(s) Performed: EXPLORATORY LAPAROTOMY, SMALL BOWEL RESECTION TIMES TWO. (N/A Abdomen)  Patient Location: ICU  Anesthesia Type:General  Level of Consciousness: sedated and unresponsive  Airway & Oxygen Therapy: Patient remains intubated per anesthesia plan and Patient placed on Ventilator (see vital sign flow sheet for setting)  Post-op Assessment: Report given to RN and Post -op Vital signs reviewed and stable  Post vital signs: Reviewed and stable  Last Vitals:  Vitals Value Taken Time  BP 124/90 03/23/20 0118  Temp 36.5 C 03/23/20 0117  Pulse 98 03/23/20 0127  Resp 18 03/23/20 0127  SpO2 100 % 03/23/20 0127  Vitals shown include unvalidated device data.  Last Pain:  Vitals:   03/23/20 0117  TempSrc: Oral         Complications: No complications documented.

## 2020-03-23 NOTE — Anesthesia Postprocedure Evaluation (Signed)
Anesthesia Post Note  Patient: Allen Rivas  Procedure(s) Performed: EXPLORATORY LAPAROTOMY, SMALL BOWEL RESECTION TIMES TWO. (N/A Abdomen)     Patient location during evaluation: SICU Anesthesia Type: General Level of consciousness: sedated Pain management: pain level controlled Vital Signs Assessment: post-procedure vital signs reviewed and stable Respiratory status: patient remains intubated per anesthesia plan Cardiovascular status: stable Postop Assessment: no apparent nausea or vomiting Anesthetic complications: no   No complications documented.  Last Vitals:  Vitals:   03/23/20 0117 03/23/20 0122  BP:    Pulse: (!) 108 100  Resp:  (!) 22  Temp: 36.5 C   SpO2:  100%    Last Pain:  Vitals:   03/23/20 0117  TempSrc: Oral                 Effie Berkshire

## 2020-03-23 NOTE — Progress Notes (Signed)
Patient self extubated. No noted respiratory issues at this time.

## 2020-03-24 ENCOUNTER — Encounter (HOSPITAL_COMMUNITY): Payer: Self-pay | Admitting: General Surgery

## 2020-03-24 MED ORDER — METHOCARBAMOL 500 MG PO TABS
1000.0000 mg | ORAL_TABLET | Freq: Three times a day (TID) | ORAL | Status: DC
  Administered 2020-03-24 – 2020-03-27 (×9): 1000 mg via ORAL
  Filled 2020-03-24 (×9): qty 2

## 2020-03-24 MED ORDER — KETOROLAC TROMETHAMINE 15 MG/ML IJ SOLN
30.0000 mg | Freq: Four times a day (QID) | INTRAMUSCULAR | Status: DC
  Administered 2020-03-24 – 2020-03-27 (×11): 30 mg via INTRAVENOUS
  Filled 2020-03-24 (×11): qty 2

## 2020-03-24 MED ORDER — ACETAMINOPHEN 500 MG PO TABS
1000.0000 mg | ORAL_TABLET | Freq: Four times a day (QID) | ORAL | Status: DC
  Administered 2020-03-24 – 2020-03-27 (×12): 1000 mg via ORAL
  Filled 2020-03-24 (×12): qty 2

## 2020-03-24 MED ORDER — OXYCODONE HCL 5 MG/5ML PO SOLN
5.0000 mg | ORAL | Status: DC | PRN
  Administered 2020-03-24 – 2020-03-25 (×3): 10 mg via ORAL
  Filled 2020-03-24 (×4): qty 10

## 2020-03-24 MED ORDER — ENOXAPARIN SODIUM 30 MG/0.3ML ~~LOC~~ SOLN
30.0000 mg | Freq: Two times a day (BID) | SUBCUTANEOUS | Status: DC
  Administered 2020-03-24 (×2): 30 mg via SUBCUTANEOUS
  Filled 2020-03-24 (×2): qty 0.3
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
## 2020-03-25 LAB — PHOSPHORUS: Phosphorus: 1.9 mg/dL — ABNORMAL LOW (ref 2.5–4.6)

## 2020-03-25 LAB — BASIC METABOLIC PANEL
Anion gap: 8 (ref 5–15)
BUN: 6 mg/dL (ref 6–20)
CO2: 21 mmol/L — ABNORMAL LOW (ref 22–32)
Calcium: 8.1 mg/dL — ABNORMAL LOW (ref 8.9–10.3)
Chloride: 109 mmol/L (ref 98–111)
Creatinine, Ser: 0.96 mg/dL (ref 0.61–1.24)
GFR calc Af Amer: >60 mL/min (ref >60)
GFR calc non Af Amer: >60 mL/min (ref >60)
Glucose, Bld: 112 mg/dL — ABNORMAL HIGH (ref 70–99)
Potassium: 3.3 mmol/L — ABNORMAL LOW (ref 3.5–5.1)
Sodium: 138 mmol/L (ref 135–145)

## 2020-03-25 LAB — MAGNESIUM: Magnesium: 2.2 mg/dL (ref 1.7–2.4)

## 2020-03-25 LAB — SURGICAL PATHOLOGY

## 2020-03-25 MED ORDER — POTASSIUM CHLORIDE 20 MEQ/15ML (10%) PO SOLN
30.0000 meq | Freq: Two times a day (BID) | ORAL | Status: AC
  Administered 2020-03-25 (×2): 30 meq via ORAL
  Filled 2020-03-25 (×2): qty 30

## 2020-03-25 MED ORDER — ENOXAPARIN SODIUM 30 MG/0.3ML ~~LOC~~ SOLN
30.0000 mg | Freq: Two times a day (BID) | SUBCUTANEOUS | Status: DC
  Administered 2020-03-25 – 2020-03-27 (×4): 30 mg via SUBCUTANEOUS
  Filled 2020-03-25 (×4): qty 0.3

## 2020-03-25 MED ORDER — POTASSIUM CHLORIDE 10 MEQ/100ML IV SOLN: 10.0000 meq | INTRAVENOUS | Status: DC

## 2020-03-25 MED ORDER — ENOXAPARIN SODIUM 30 MG/0.3ML ~~LOC~~ SOLN: 30.0000 mg | Freq: Two times a day (BID) | SUBCUTANEOUS | Status: DC

## 2020-03-25 MED ORDER — POTASSIUM PHOSPHATES 15 MMOLE/5ML IV SOLN
30.0000 mmol | Freq: Once | INTRAVENOUS | Status: AC
  Administered 2020-03-25: 30 mmol via INTRAVENOUS
  Filled 2020-03-25: qty 10

## 2020-03-25 NOTE — Progress Notes (Signed)
  Speech Language Pathology    Patient Details Name: Allen Rivas MRN: [id number] DOB: [birth year] Today's Date: 03/25/2020 Time:  -     Orders received for cognitive-speech evaluation. Currently no orders to assess swallow - please order if needed.                   Houston Siren 03/25/2020, 8:34 AM Orbie Pyo Colvin Caroli.Ed Risk analyst [phone] Office [phone]
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
## 2020-03-25 NOTE — TOC CAGE-AID Note (Signed)
Transition of Care [facility]) - CAGE-AID Screening   Patient Details  Name: Allen Rivas MRN: [id number] Date of Birth: 1996-01-20  Transition of [facility]) CM/SW Contact:    Emeterio Reeve, [place] Phone Number: 03/25/2020, 3:40 PM   Clinical Narrative:  CSW introduced self via phone and explained role.  Pt reports daily alcohol use for the last couple of weeks. Pt did not want to talk about why he was drinking more than usual. Pt did state that he knows he should stop and he does feel guilty about his use.Pt denies substance use. Pt was receptive to community resources but states he can stop on his on.   CAGE-AID Screening:    Have You Ever Felt You Ought to Cut Down on Your Drinking or Drug Use?: Yes Have People Annoyed You By Critizing Your Drinking Or Drug Use?: No Have You Felt Bad Or Guilty About Your Drinking Or Drug Use?: Yes Have You Ever Had a Drink or Used Drugs First Thing In The Morning to Steady Your Nerves or to Get Rid of a Hangover?: No CAGE-AID Score: 2  Substance Abuse Education Offered: Yes  Substance abuse interventions: Patient Counseling, Other (must comment) (pt was receptive to resource packet)   Emeterio Reeve, [place], [place] Social Worker [phone]

## 2020-03-26 LAB — BASIC METABOLIC PANEL
Anion gap: 9 (ref 5–15)
BUN: 6 mg/dL (ref 6–20)
CO2: 20 mmol/L — ABNORMAL LOW (ref 22–32)
Calcium: 8.1 mg/dL — ABNORMAL LOW (ref 8.9–10.3)
Chloride: 108 mmol/L (ref 98–111)
Creatinine, Ser: 0.93 mg/dL (ref 0.61–1.24)
GFR calc non Af Amer: >60 mL/min (ref >60)
Glucose, Bld: 119 mg/dL — ABNORMAL HIGH (ref 70–99)
Potassium: 3.7 mmol/L (ref 3.5–5.1)
Sodium: 137 mmol/L (ref 135–145)

## 2020-03-26 LAB — CBC
HCT: 32.5 % — ABNORMAL LOW (ref 39.0–52.0)
Hemoglobin: 11.3 g/dL — ABNORMAL LOW (ref 13.0–17.0)
MCH: 30.1 pg (ref 26.0–34.0)
MCHC: 34.8 g/dL (ref 30.0–36.0)
MCV: 86.4 fL (ref 80.0–100.0)
Platelets: UNDETERMINED 10*3/uL (ref 150–400)
RBC: 3.76 MIL/uL — ABNORMAL LOW (ref 4.22–5.81)
RDW: 12.6 % (ref 11.5–15.5)
WBC: 7.7 10*3/uL (ref 4.0–10.5)
nRBC: 0 % (ref 0.0–0.2)

## 2020-03-26 LAB — MAGNESIUM: Magnesium: 1.7 mg/dL (ref 1.7–2.4)

## 2020-03-26 LAB — PHOSPHORUS: Phosphorus: 2.1 mg/dL — ABNORMAL LOW (ref 2.5–4.6)

## 2020-03-26 MED ORDER — POTASSIUM CHLORIDE 20 MEQ/15ML (10%) PO SOLN
40.0000 meq | Freq: Once | ORAL | Status: AC
  Administered 2020-03-26: 40 meq via ORAL
  Filled 2020-03-26: qty 30

## 2020-03-26 MED ORDER — MAGNESIUM OXIDE 400 (241.3 MG) MG PO TABS
400.0000 mg | ORAL_TABLET | Freq: Two times a day (BID) | ORAL | Status: AC
  Administered 2020-03-26 (×2): 400 mg via ORAL
  Filled 2020-03-26 (×2): qty 1

## 2020-03-26 MED ORDER — KCL IN DEXTROSE-NACL 20-5-0.45 MEQ/L-%-% IV SOLN
INTRAVENOUS | Status: DC
  Filled 2020-03-26 (×2): qty 1000
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
## 2020-03-26 NOTE — Discharge Instructions (Signed)
[facility]      [facility] [phone]  OPEN ABDOMINAL SURGERY: POST OP INSTRUCTIONS  Always review your discharge instruction sheet given to you by the facility where your surgery was performed.  IF YOU HAVE DISABILITY OR FAMILY LEAVE FORMS, YOU MUST BRING THEM TO THE OFFICE FOR PROCESSING.  PLEASE DO NOT GIVE THEM TO YOUR DOCTOR.  1. A prescription for pain medication may be given to you upon discharge.  Take your pain medication as prescribed, if needed.  If narcotic pain medicine is not needed, then you may take acetaminophen (Tylenol) or ibuprofen (Advil) as needed. 2. Take your usually prescribed medications unless otherwise directed. 3. If you need a refill on your pain medication, please contact your pharmacy. They will contact our office to request authorization.  Prescriptions will not be filled after 5pm or on week-ends. 4. You should follow a light diet the first few days after arrival home, such as soup and crackers, pudding, etc.unless your doctor has advised otherwise. A high-fiber, low fat diet can be resumed as tolerated.   Be sure to include lots of fluids daily. Most patients will experience some swelling and bruising on the chest and neck area.  Ice packs will help.  Swelling and bruising can take several days to resolve 5. Most patients will experience some swelling and bruising in the area of the incision. Ice pack will help. Swelling and bruising can take several days to resolve..  6. It is common to experience some constipation if taking pain medication after surgery.  Increasing fluid intake and taking a stool softener will usually help or prevent this problem from occurring.  A mild laxative (Milk of Magnesia or Miralax) should be taken according to package directions if there are no bowel movements after 48 hours. 7.  You may have steri-strips (small skin tapes) in place directly over the incision.  These strips should be left on the skin for 7-10 days.  If your  surgeon used skin glue on the incision, you may shower in 24 hours.  The glue will flake off over the next 2-3 weeks.  Any sutures or staples will be removed at the office during your follow-up visit. You may find that a light gauze bandage over your incision may keep your staples from being rubbed or pulled. You may shower and replace the bandage daily. 8. ACTIVITIES:  You may resume regular (light) daily activities beginning the next day--such as daily self-care, walking, climbing stairs--gradually increasing activities as tolerated.  You may have sexual intercourse when it is comfortable.  Refrain from any heavy lifting or straining until approved by your doctor. a. You may drive when you no longer are taking prescription pain medication, you can comfortably wear a seatbelt, and you can safely maneuver your car and apply brakes b. Return to Work: ___________________________________ 9. You should see your doctor in the office for a follow-up appointment approximately two weeks after your surgery.  Make sure that you call for this appointment within a day or two after you arrive home to insure a convenient appointment time. OTHER INSTRUCTIONS:  _____________________________________________________________ _____________________________________________________________  WHEN TO CALL YOUR DOCTOR: 1. Fever over 101.0 2. Inability to urinate 3. Nausea and/or vomiting 4. Extreme swelling or bruising 5. Continued bleeding from incision. 6. Increased pain, redness, or drainage from the incision. 7. Difficulty swallowing or breathing 8. Muscle cramping or spasms. 9. Numbness or tingling in hands or feet or around lips.  The clinic staff is available to   answer your questions during regular business hours.  Please don't hesitate to call and ask to speak to one of the nurses if you have concerns.  For further questions, please visit [email or web address]   

## 2020-03-27 ENCOUNTER — Encounter (HOSPITAL_COMMUNITY): Payer: Self-pay

## 2020-03-27 LAB — PHOSPHORUS: Phosphorus: 2.6 mg/dL (ref 2.5–4.6)

## 2020-03-27 LAB — BASIC METABOLIC PANEL
Anion gap: 8 (ref 5–15)
BUN: 14 mg/dL (ref 6–20)
CO2: 20 mmol/L — ABNORMAL LOW (ref 22–32)
Calcium: 8.3 mg/dL — ABNORMAL LOW (ref 8.9–10.3)
Chloride: 110 mmol/L (ref 98–111)
Creatinine, Ser: 0.87 mg/dL (ref 0.61–1.24)
GFR calc non Af Amer: >60 mL/min (ref >60)
Glucose, Bld: 97 mg/dL (ref 70–99)
Potassium: 3.6 mmol/L (ref 3.5–5.1)
Sodium: 138 mmol/L (ref 135–145)

## 2020-03-27 LAB — MAGNESIUM: Magnesium: 2 mg/dL (ref 1.7–2.4)

## 2020-03-27 MED ORDER — IBUPROFEN 200 MG PO TABS: 400.0000 mg | ORAL_TABLET | Freq: Three times a day (TID) | ORAL | Status: DC | PRN

## 2020-03-27 MED ORDER — ACETAMINOPHEN 500 MG PO TABS
1000.0000 mg | ORAL_TABLET | Freq: Four times a day (QID) | ORAL | 0 refills | Status: DC
Start: 2020-03-27 — End: 2020-05-12

## 2020-03-27 MED ORDER — OXYCODONE HCL 5 MG/5ML PO SOLN
5.0000 mg | Freq: Four times a day (QID) | ORAL | 0 refills | Status: AC | PRN
Start: 2020-03-27 — End: 2020-03-30

## 2020-03-27 MED ORDER — METHOCARBAMOL 500 MG PO TABS: 500.0000 mg | ORAL_TABLET | Freq: Three times a day (TID) | ORAL | 0 refills | Status: DC | PRN

## 2020-03-27 NOTE — Plan of Care (Signed)
  Problem: Activity: Goal: Risk for activity intolerance will decrease Outcome: Progressing   Problem: Pain Managment: Goal: General experience of comfort will improve Outcome: Progressing   

## 2020-03-27 NOTE — Progress Notes (Signed)
Allen Rivas Glassing to be D/C'd  per MD order. Discussed with the patient and all questions fully answered.  VSS, Skin clean, dry and intact without evidence of skin break down, no evidence of skin tears noted.  IV catheter discontinued intact. Site without signs and symptoms of complications. Dressing and pressure applied.  An After Visit Summary was printed and given to the patient. Patient received prescription.  D/c education completed with patient including follow up instructions, medication list, d/c activities limitations if indicated, with other d/c instructions as indicated by MD - patient able to verbalize understanding, all questions fully answered.   Patient instructed to return to ED, call 911, or call MD for any changes in condition.   Patient to be escorted via [place], and D/C home via private auto.  Patient given number of [facility] [employer] to retrieve phone and belongings.  Patient given a note from physician of 6 weeks until able to return to work.

## 2020-03-28 ENCOUNTER — Encounter (HOSPITAL_COMMUNITY): Payer: Self-pay

## 2020-04-23 ENCOUNTER — Telehealth: Payer: Self-pay | Admitting: *Deleted

## 2020-04-23 NOTE — Telephone Encounter (Signed)
Pt came in and demanded a letter from PCP to return to work from his hospital stay in early October.  I explained to patient that he has not been seen in our office since 2017 so he was technically no longer a patient but I would be happy to set him up an appt to meet PCP and get the letter he is requesting.    He then demands that I go get Dr. Maudie Mercury immediately so that he can speak with her.  I advised that is not how this works and that she was not in clinic at the moment anyways.  At this time he uses foul language and tells me to "give me the appt then" and continues to use the foul language and speaking very loudly in the waiting room where other patients are present.  I asked that he lower his voice and not use that language.  He obliges but is still visibly disgruntled.   I go back to my office to find an appt and at that time was informed that the patient called [place] derogatory names, continued cussing and left.   Discussed with Dr. Gwendlyn Deutscher.  We will not reinstate patient @ [facility] due to this behavior.  Christen Bame, CMA

## 2020-05-10 ENCOUNTER — Encounter (HOSPITAL_COMMUNITY): Payer: Self-pay | Admitting: Emergency Medicine

## 2020-05-10 ENCOUNTER — Emergency Department (HOSPITAL_COMMUNITY): Payer: Medicaid Other

## 2020-05-10 ENCOUNTER — Inpatient Hospital Stay (HOSPITAL_COMMUNITY)
Admission: EM | Admit: 2020-05-10 | Discharge: 2020-05-12 | DRG: 091 | Payer: Medicaid Other | Attending: Emergency Medicine | Admitting: Emergency Medicine

## 2020-05-10 ENCOUNTER — Other Ambulatory Visit: Payer: Self-pay

## 2020-05-10 DIAGNOSIS — S0081XA Abrasion of other part of head, initial encounter: Secondary | ICD-10-CM | POA: Diagnosis present

## 2020-05-10 DIAGNOSIS — F17210 Nicotine dependence, cigarettes, uncomplicated: Secondary | ICD-10-CM | POA: Diagnosis present

## 2020-05-10 DIAGNOSIS — T1490XA Injury, unspecified, initial encounter: Secondary | ICD-10-CM

## 2020-05-10 DIAGNOSIS — F10920 Alcohol use, unspecified with intoxication, uncomplicated: Secondary | ICD-10-CM

## 2020-05-10 DIAGNOSIS — T50905A Adverse effect of unspecified drugs, medicaments and biological substances, initial encounter: Secondary | ICD-10-CM | POA: Diagnosis present

## 2020-05-10 DIAGNOSIS — Z20822 Contact with and (suspected) exposure to covid-19: Secondary | ICD-10-CM | POA: Diagnosis present

## 2020-05-10 DIAGNOSIS — Z79899 Other long term (current) drug therapy: Secondary | ICD-10-CM

## 2020-05-10 DIAGNOSIS — E876 Hypokalemia: Secondary | ICD-10-CM | POA: Diagnosis present

## 2020-05-10 DIAGNOSIS — R4182 Altered mental status, unspecified: Principal | ICD-10-CM

## 2020-05-10 DIAGNOSIS — Y92410 Unspecified street and highway as the place of occurrence of the external cause: Secondary | ICD-10-CM

## 2020-05-10 DIAGNOSIS — J9600 Acute respiratory failure, unspecified whether with hypoxia or hypercapnia: Secondary | ICD-10-CM

## 2020-05-10 DIAGNOSIS — G929 Unspecified toxic encephalopathy: Principal | ICD-10-CM | POA: Diagnosis present

## 2020-05-10 LAB — URINALYSIS, ROUTINE W REFLEX MICROSCOPIC
Bilirubin Urine: NEGATIVE
Glucose, UA: NEGATIVE mg/dL
Hgb urine dipstick: NEGATIVE
Ketones, ur: NEGATIVE mg/dL
Leukocytes,Ua: NEGATIVE
Nitrite: NEGATIVE
Protein, ur: NEGATIVE mg/dL
Specific Gravity, Urine: 1.005 (ref 1.005–1.030)
pH: 5 (ref 5.0–8.0)

## 2020-05-10 LAB — COMPREHENSIVE METABOLIC PANEL
ALT: 13 U/L (ref 0–44)
AST: 22 U/L (ref 15–41)
Albumin: 3.9 g/dL (ref 3.5–5.0)
Alkaline Phosphatase: 40 U/L (ref 38–126)
Anion gap: 15 (ref 5–15)
BUN: 5 mg/dL — ABNORMAL LOW (ref 6–20)
CO2: 17 mmol/L — ABNORMAL LOW (ref 22–32)
Calcium: 8.3 mg/dL — ABNORMAL LOW (ref 8.9–10.3)
Chloride: 111 mmol/L (ref 98–111)
Creatinine, Ser: 0.76 mg/dL (ref 0.61–1.24)
GFR, Estimated: >60 mL/min (ref >60)
Glucose, Bld: 90 mg/dL (ref 70–99)
Potassium: 3.7 mmol/L (ref 3.5–5.1)
Sodium: 143 mmol/L (ref 135–145)
Total Bilirubin: 0.5 mg/dL (ref 0.3–1.2)
Total Protein: 7 g/dL (ref 6.5–8.1)

## 2020-05-10 LAB — CBC WITH DIFFERENTIAL/PLATELET
Abs Immature Granulocytes: 0.04 10*3/uL (ref 0.00–0.07)
Basophils Absolute: 0 10*3/uL (ref 0.0–0.1)
Basophils Relative: 1 %
Eosinophils Absolute: 0 10*3/uL (ref 0.0–0.5)
Eosinophils Relative: 0 %
HCT: 49.1 % (ref 39.0–52.0)
Hemoglobin: 16.7 g/dL (ref 13.0–17.0)
Immature Granulocytes: 1 %
Lymphocytes Relative: 24 %
Lymphs Abs: 1.6 10*3/uL (ref 0.7–4.0)
MCH: 30.6 pg (ref 26.0–34.0)
MCHC: 34 g/dL (ref 30.0–36.0)
MCV: 89.9 fL (ref 80.0–100.0)
Monocytes Absolute: 0.3 10*3/uL (ref 0.1–1.0)
Monocytes Relative: 5 %
Neutro Abs: 4.7 10*3/uL (ref 1.7–7.7)
Neutrophils Relative %: 69 %
Platelets: 370 10*3/uL (ref 150–400)
RBC: 5.46 MIL/uL (ref 4.22–5.81)
RDW: 13.3 % (ref 11.5–15.5)
WBC: 6.7 10*3/uL (ref 4.0–10.5)
nRBC: 0 % (ref 0.0–0.2)

## 2020-05-10 LAB — I-STAT ARTERIAL BLOOD GAS, ED
Acid-base deficit: 5 mmol/L — ABNORMAL HIGH (ref 0.0–2.0)
Bicarbonate: 20.3 mmol/L (ref 20.0–28.0)
Calcium, Ion: 1.08 mmol/L — ABNORMAL LOW (ref 1.15–1.40)
HCT: 48 % (ref 39.0–52.0)
Hemoglobin: 16.3 g/dL (ref 13.0–17.0)
O2 Saturation: 100 %
Patient temperature: 95.8
Potassium: 3.7 mmol/L (ref 3.5–5.1)
Sodium: 146 mmol/L — ABNORMAL HIGH (ref 135–145)
TCO2: 21 mmol/L — ABNORMAL LOW (ref 22–32)
pCO2 arterial: 35.3 mmHg (ref 32.0–48.0)
pH, Arterial: 7.361 (ref 7.350–7.450)
pO2, Arterial: 208 mmHg — ABNORMAL HIGH (ref 83.0–108.0)

## 2020-05-10 LAB — RAPID URINE DRUG SCREEN, HOSP PERFORMED
Amphetamines: NOT DETECTED
Barbiturates: NOT DETECTED
Benzodiazepines: NOT DETECTED
Cocaine: NOT DETECTED
Opiates: NOT DETECTED
Tetrahydrocannabinol: NOT DETECTED

## 2020-05-10 LAB — APTT: aPTT: 31 seconds (ref 24–36)

## 2020-05-10 LAB — RESPIRATORY PANEL BY RT PCR (FLU A&B, COVID)
Influenza A by PCR: NEGATIVE
Influenza B by PCR: NEGATIVE
SARS Coronavirus 2 by RT PCR: NEGATIVE

## 2020-05-10 LAB — PROTIME-INR
INR: 1.1 (ref 0.8–1.2)
Prothrombin Time: 13.6 seconds (ref 11.4–15.2)

## 2020-05-10 LAB — AMMONIA: Ammonia: 33 umol/L (ref 9–35)

## 2020-05-10 LAB — TRIGLYCERIDES: Triglycerides: 132 mg/dL (ref <150)

## 2020-05-10 LAB — ACETAMINOPHEN LEVEL: Acetaminophen (Tylenol), Serum: <10 ug/mL — ABNORMAL LOW (ref 10–30)

## 2020-05-10 LAB — SALICYLATE LEVEL: Salicylate Lvl: <7 mg/dL — ABNORMAL LOW (ref 7.0–30.0)

## 2020-05-10 LAB — CBG MONITORING, ED: Glucose-Capillary: 78 mg/dL (ref 70–99)

## 2020-05-10 LAB — ETHANOL: Alcohol, Ethyl (B): 298 mg/dL — ABNORMAL HIGH (ref <10)

## 2020-05-10 MED ORDER — CHARCOAL ACTIVATED PO LIQD
50.0000 g | Freq: Once | ORAL | Status: AC
  Administered 2020-05-10: 50 g via ORAL
  Filled 2020-05-10: qty 240

## 2020-05-10 MED ORDER — FENTANYL BOLUS VIA INFUSION
50.0000 ug | INTRAVENOUS | Status: DC | PRN
  Filled 2020-05-10: qty 50

## 2020-05-10 MED ORDER — FENTANYL 2500MCG IN NS 250ML (10MCG/ML) PREMIX INFUSION
25.0000 ug/h | INTRAVENOUS | Status: DC
  Administered 2020-05-10: 25 ug/h via INTRAVENOUS
  Filled 2020-05-10: qty 250

## 2020-05-10 MED ORDER — ROCURONIUM BROMIDE 50 MG/5ML IV SOLN: 1.0000 mg/kg | Freq: Once | INTRAVENOUS | Status: DC

## 2020-05-10 MED ORDER — SODIUM CHLORIDE 0.9 % IV BOLUS
1000.0000 mL | Freq: Once | INTRAVENOUS | Status: AC
  Administered 2020-05-10: 1000 mL via INTRAVENOUS

## 2020-05-10 MED ORDER — ETOMIDATE 2 MG/ML IV SOLN
INTRAVENOUS | Status: AC
  Filled 2020-05-10: qty 20

## 2020-05-10 MED ORDER — MIDAZOLAM HCL 2 MG/2ML IJ SOLN
INTRAMUSCULAR | Status: AC
  Filled 2020-05-10: qty 4

## 2020-05-10 MED ORDER — PROPOFOL 1000 MG/100ML IV EMUL
0.0000 ug/kg/min | INTRAVENOUS | Status: DC
  Administered 2020-05-10: 20 ug/kg/min via INTRAVENOUS
  Administered 2020-05-11: 50 ug/kg/min via INTRAVENOUS
  Filled 2020-05-10: qty 100

## 2020-05-10 MED ORDER — ROCURONIUM BROMIDE 50 MG/5ML IV SOLN
INTRAVENOUS | Status: AC | PRN
  Administered 2020-05-10: 70 mg via INTRAVENOUS

## 2020-05-10 MED ORDER — PROPOFOL 1000 MG/100ML IV EMUL
INTRAVENOUS | Status: AC
  Administered 2020-05-10: 10 ug/kg/min via INTRAVENOUS
  Filled 2020-05-10: qty 100

## 2020-05-10 MED ORDER — ROCURONIUM BROMIDE 10 MG/ML (PF) SYRINGE
PREFILLED_SYRINGE | INTRAVENOUS | Status: AC
  Filled 2020-05-10: qty 10

## 2020-05-10 MED ORDER — NALOXONE HCL 2 MG/2ML IJ SOSY
PREFILLED_SYRINGE | INTRAMUSCULAR | Status: AC
  Filled 2020-05-10: qty 2

## 2020-05-10 MED ORDER — FENTANYL CITRATE (PF) 100 MCG/2ML IJ SOLN
INTRAMUSCULAR | Status: AC
  Filled 2020-05-10: qty 2

## 2020-05-10 MED ORDER — IOHEXOL 300 MG/ML  SOLN
100.0000 mL | Freq: Once | INTRAMUSCULAR | Status: AC | PRN
  Administered 2020-05-10: 100 mL via INTRAVENOUS

## 2020-05-10 MED ORDER — ETOMIDATE 2 MG/ML IV SOLN
INTRAVENOUS | Status: AC | PRN
  Administered 2020-05-10: 20 mg via INTRAVENOUS

## 2020-05-10 MED ORDER — FENTANYL CITRATE (PF) 100 MCG/2ML IJ SOLN
50.0000 ug | Freq: Once | INTRAMUSCULAR | Status: AC
  Administered 2020-05-10: 50 ug via INTRAVENOUS
  Filled 2020-05-10: qty 2

## 2020-05-10 MED ORDER — SODIUM CHLORIDE 0.9 % IV SOLN: INTRAVENOUS | Status: DC

## 2020-05-10 MED ORDER — PROPOFOL 1000 MG/100ML IV EMUL: 0.0000 ug/kg/min | INTRAVENOUS | Status: DC

## 2020-05-10 MED ORDER — LORAZEPAM 2 MG/ML IJ SOLN
INTRAMUSCULAR | Status: AC
  Filled 2020-05-10: qty 1
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
## 2020-05-10 NOTE — Procedures (Signed)
Intubation Procedure Note  Allen Rivas  [id number]  1996/03/19  Date:05/10/20  Time:10:55 PM   Provider Performing:Vernie Piet, Carver Fila    Procedure: Intubation (87276)  Indication(s) Respiratory Failure  Consent Unable to obtain consent due to emergent nature of procedure.   Anesthesia Etomidate    Time Out Verified patient identification, verified procedure, site/side was marked, verified correct patient position, special equipment/implants available, medications/allergies/relevant history reviewed, required imaging and test results available.   Sterile Technique Usual hand hygeine, masks, and gloves were used   Procedure Description Patient positioned in bed supine.  Sedation given as noted above.  Patient was intubated with endotracheal tube using Glidescope.  View was Grade 1 full glottis .  Number of attempts was 1.  Colorimetric CO2 detector was consistent with tracheal placement.   Complications/Tolerance None; patient tolerated the procedure well. Chest X-ray is ordered to verify placement.   EBL Minimal   Specimen(s) None
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
## 2020-05-11 DIAGNOSIS — R4182 Altered mental status, unspecified: Secondary | ICD-10-CM | POA: Diagnosis present

## 2020-05-11 DIAGNOSIS — T50901A Poisoning by unspecified drugs, medicaments and biological substances, accidental (unintentional), initial encounter: Secondary | ICD-10-CM

## 2020-05-11 DIAGNOSIS — R4020 Unspecified coma: Secondary | ICD-10-CM

## 2020-05-11 DIAGNOSIS — J9600 Acute respiratory failure, unspecified whether with hypoxia or hypercapnia: Secondary | ICD-10-CM

## 2020-05-11 DIAGNOSIS — J988 Other specified respiratory disorders: Secondary | ICD-10-CM

## 2020-05-11 LAB — BASIC METABOLIC PANEL
Anion gap: 13 (ref 5–15)
BUN: <5 mg/dL — ABNORMAL LOW (ref 6–20)
CO2: 19 mmol/L — ABNORMAL LOW (ref 22–32)
Calcium: 8.3 mg/dL — ABNORMAL LOW (ref 8.9–10.3)
Chloride: 112 mmol/L — ABNORMAL HIGH (ref 98–111)
Creatinine, Ser: 0.85 mg/dL (ref 0.61–1.24)
GFR, Estimated: >60 mL/min (ref >60)
Glucose, Bld: 86 mg/dL (ref 70–99)
Potassium: 3.2 mmol/L — ABNORMAL LOW (ref 3.5–5.1)
Sodium: 144 mmol/L (ref 135–145)

## 2020-05-11 LAB — CBC
HCT: 45.1 % (ref 39.0–52.0)
HCT: 45.7 % (ref 39.0–52.0)
Hemoglobin: 16 g/dL (ref 13.0–17.0)
Hemoglobin: 16 g/dL (ref 13.0–17.0)
MCH: 30.4 pg (ref 26.0–34.0)
MCH: 30.7 pg (ref 26.0–34.0)
MCHC: 35 g/dL (ref 30.0–36.0)
MCHC: 35.5 g/dL (ref 30.0–36.0)
MCV: 86.6 fL (ref 80.0–100.0)
MCV: 86.7 fL (ref 80.0–100.0)
Platelets: 377 10*3/uL (ref 150–400)
Platelets: 391 10*3/uL (ref 150–400)
RBC: 5.21 MIL/uL (ref 4.22–5.81)
RBC: 5.27 MIL/uL (ref 4.22–5.81)
RDW: 13.2 % (ref 11.5–15.5)
RDW: 13.2 % (ref 11.5–15.5)
WBC: 5.9 10*3/uL (ref 4.0–10.5)
WBC: 6.1 10*3/uL (ref 4.0–10.5)
nRBC: 0 % (ref 0.0–0.2)
nRBC: 0 % (ref 0.0–0.2)

## 2020-05-11 LAB — MRSA PCR SCREENING: MRSA by PCR: NEGATIVE

## 2020-05-11 LAB — CREATININE, SERUM
Creatinine, Ser: 0.88 mg/dL (ref 0.61–1.24)
GFR, Estimated: >60 mL/min (ref >60)

## 2020-05-11 LAB — HIV ANTIBODY (ROUTINE TESTING W REFLEX): HIV Screen 4th Generation wRfx: NONREACTIVE

## 2020-05-11 LAB — GLUCOSE, CAPILLARY: Glucose-Capillary: 106 mg/dL — ABNORMAL HIGH (ref 70–99)

## 2020-05-11 LAB — MAGNESIUM: Magnesium: 2 mg/dL (ref 1.7–2.4)

## 2020-05-11 MED ORDER — HEPARIN SODIUM (PORCINE) 5000 UNIT/ML IJ SOLN
5000.0000 [IU] | Freq: Three times a day (TID) | INTRAMUSCULAR | Status: DC
  Administered 2020-05-11 – 2020-05-12 (×4): 5000 [IU] via SUBCUTANEOUS
  Filled 2020-05-11 (×4): qty 1

## 2020-05-11 MED ORDER — PROPOFOL 1000 MG/100ML IV EMUL
0.0000 ug/kg/min | INTRAVENOUS | Status: DC
  Filled 2020-05-11: qty 100

## 2020-05-11 MED ORDER — IBUPROFEN 200 MG PO TABS
400.0000 mg | ORAL_TABLET | Freq: Four times a day (QID) | ORAL | Status: DC | PRN
  Administered 2020-05-11 – 2020-05-12 (×2): 400 mg via ORAL
  Filled 2020-05-11 (×2): qty 2

## 2020-05-11 MED ORDER — DOCUSATE SODIUM 50 MG/5ML PO LIQD: 100.0000 mg | Freq: Two times a day (BID) | ORAL | Status: DC | PRN

## 2020-05-11 MED ORDER — FENTANYL CITRATE (PF) 100 MCG/2ML IJ SOLN: 50.0000 ug | INTRAMUSCULAR | Status: DC | PRN

## 2020-05-11 MED ORDER — POLYETHYLENE GLYCOL 3350 17 G PO PACK: 17.0000 g | PACK | Freq: Every day | ORAL | Status: DC | PRN

## 2020-05-11 MED ORDER — CHLORHEXIDINE GLUCONATE CLOTH 2 % EX PADS
6.0000 | MEDICATED_PAD | Freq: Every day | CUTANEOUS | Status: DC
  Administered 2020-05-11 – 2020-05-12 (×2): 6 via TOPICAL

## 2020-05-11 MED ORDER — FAMOTIDINE IN NACL 20-0.9 MG/50ML-% IV SOLN
20.0000 mg | Freq: Two times a day (BID) | INTRAVENOUS | Status: DC
  Administered 2020-05-11 – 2020-05-12 (×3): 20 mg via INTRAVENOUS
  Filled 2020-05-11 (×3): qty 50

## 2020-05-11 MED ORDER — THIAMINE HCL 100 MG/ML IJ SOLN
100.0000 mg | Freq: Every day | INTRAMUSCULAR | Status: DC
  Administered 2020-05-11 – 2020-05-12 (×2): 100 mg via INTRAVENOUS
  Filled 2020-05-11 (×2): qty 2

## 2020-05-11 MED ORDER — FOLIC ACID 1 MG PO TABS
1.0000 mg | ORAL_TABLET | Freq: Every day | ORAL | Status: DC
  Administered 2020-05-11 – 2020-05-12 (×2): 1 mg
  Filled 2020-05-11 (×2): qty 1

## 2020-05-11 MED ORDER — CHLORHEXIDINE GLUCONATE 0.12% ORAL RINSE (MEDLINE KIT)
15.0000 mL | Freq: Two times a day (BID) | OROMUCOSAL | Status: DC
  Administered 2020-05-11 – 2020-05-12 (×3): 15 mL via OROMUCOSAL

## 2020-05-11 MED ORDER — ORAL CARE MOUTH RINSE
15.0000 mL | OROMUCOSAL | Status: DC
  Administered 2020-05-11 – 2020-05-12 (×4): 15 mL via OROMUCOSAL

## 2020-05-11 MED ORDER — ONDANSETRON HCL 4 MG/2ML IJ SOLN
4.0000 mg | Freq: Once | INTRAMUSCULAR | Status: AC
  Administered 2020-05-11: 4 mg via INTRAVENOUS
  Filled 2020-05-11: qty 2

## 2020-05-11 MED ORDER — POTASSIUM CHLORIDE 20 MEQ/15ML (10%) PO SOLN
40.0000 meq | Freq: Once | ORAL | Status: AC
  Administered 2020-05-11: 40 meq
  Filled 2020-05-11: qty 30

## 2020-05-11 MED ORDER — ORAL CARE MOUTH RINSE
15.0000 mL | OROMUCOSAL | Status: DC
  Administered 2020-05-11: 15 mL via OROMUCOSAL
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
## 2020-05-11 NOTE — Procedures (Signed)
Extubation Procedure Note  Patient Details:   Name: Allen Rivas DOB: 01/10/1996 MRN: [id number]   Airway Documentation:    Vent end date: 05/11/20 Vent end time: 1153   Evaluation  O2 sats: stable throughout Complications: No apparent complications Patient did tolerate procedure well. Bilateral Breath Sounds: Clear   Yes  Pt was extubated per order and placed on 2 L [place]. Cuff leak was noted prior to extubation and no stridor post. Pt is stable at this time. RT will monitor.   Ronaldo Miyamoto 05/11/2020, 11:54 AM

## 2020-05-11 NOTE — Plan of Care (Signed)
  Problem: Respiratory: Goal: Ability to maintain a clear airway and adequate ventilation will improve 05/11/2020 0421 by Lorane Gell, RN Outcome: Progressing 05/11/2020 0420 by Lorane Gell, RN Outcome: Progressing   Problem: Clinical Measurements: Goal: Ability to maintain clinical measurements within normal limits will improve 05/11/2020 0421 by Lorane Gell, RN Outcome: Progressing 05/11/2020 0420 by Lorane Gell, RN Outcome: Progressing Goal: Will remain free from infection 05/11/2020 0421 by Lorane Gell, RN Outcome: Progressing 05/11/2020 0420 by Lorane Gell, RN Outcome: Progressing Goal: Diagnostic test results will improve 05/11/2020 0421 by Lorane Gell, RN Outcome: Progressing 05/11/2020 0420 by Lorane Gell, RN Outcome: Progressing Goal: Respiratory complications will improve 05/11/2020 0421 by Lorane Gell, RN Outcome: Progressing 05/11/2020 0420 by Lorane Gell, RN Outcome: Progressing Goal: Cardiovascular complication will be avoided 05/11/2020 0421 by Lorane Gell, RN Outcome: Progressing 05/11/2020 0420 by Lorane Gell, RN Outcome: Progressing   Problem: Elimination: Goal: Will not experience complications related to bowel motility 05/11/2020 0421 by Lorane Gell, RN Outcome: Progressing 05/11/2020 0420 by Lorane Gell, RN Outcome: Progressing Goal: Will not experience complications related to urinary retention 05/11/2020 0421 by Lorane Gell, RN Outcome: Progressing 05/11/2020 0420 by Lorane Gell, RN Outcome: Progressing   Problem: Pain Managment: Goal: General experience of comfort will improve 05/11/2020 0421 by Lorane Gell, RN Outcome: Progressing 05/11/2020 0420 by Lorane Gell, RN Outcome: Progressing   Problem: Safety: Goal: Ability to remain free from injury will improve 05/11/2020 0421 by Lorane Gell, RN Outcome: Progressing 05/11/2020 0420 by Lorane Gell, RN Outcome: Progressing    Problem: Skin Integrity: Goal: Risk for impaired skin integrity will decrease 05/11/2020 0421 by Lorane Gell, RN Outcome: Progressing 05/11/2020 0420 by Lorane Gell, RN Outcome: Progressing

## 2020-05-11 NOTE — Progress Notes (Signed)
[facility] Progress Note Patient Name: Allen Rivas DOB: 1995/11/03 MRN: [id number]   Date of Service  05/11/2020  HPI/Events of Note  Patient c/o pain related abdominal surgery March 22, 2020. Creatinine = 0.85.  eICU Interventions  Plan: 1. Motrin 400 mg PO Q 6 hours PRN pain.      Intervention Category Major Interventions: Other:  Lysle Dingwall 05/11/2020, 8:44 PM

## 2020-05-12 DIAGNOSIS — J9601 Acute respiratory failure with hypoxia: Secondary | ICD-10-CM

## 2020-05-12 DIAGNOSIS — F10920 Alcohol use, unspecified with intoxication, uncomplicated: Secondary | ICD-10-CM

## 2020-05-12 DIAGNOSIS — T1490XA Injury, unspecified, initial encounter: Secondary | ICD-10-CM

## 2020-05-12 DIAGNOSIS — R4182 Altered mental status, unspecified: Secondary | ICD-10-CM

## 2020-05-12 LAB — BASIC METABOLIC PANEL
Anion gap: 12 (ref 5–15)
BUN: 5 mg/dL — ABNORMAL LOW (ref 6–20)
CO2: 21 mmol/L — ABNORMAL LOW (ref 22–32)
Calcium: 8.7 mg/dL — ABNORMAL LOW (ref 8.9–10.3)
Chloride: 109 mmol/L (ref 98–111)
Creatinine, Ser: 0.97 mg/dL (ref 0.61–1.24)
GFR, Estimated: >60 mL/min (ref >60)
Glucose, Bld: 96 mg/dL (ref 70–99)
Potassium: 3.6 mmol/L (ref 3.5–5.1)
Sodium: 142 mmol/L (ref 135–145)

## 2020-05-12 MED ORDER — THIAMINE HCL 100 MG PO TABS: 100.0000 mg | ORAL_TABLET | Freq: Every day | ORAL | 0 refills | Status: DC

## 2020-05-12 MED ORDER — FOLIC ACID 1 MG PO TABS
1.0000 mg | ORAL_TABLET | Freq: Every day | ORAL | 0 refills | Status: DC
Start: 2020-05-13 — End: 2020-05-16

## 2020-05-12 MED ORDER — FOLIC ACID 1 MG PO TABS: 1.0000 mg | ORAL_TABLET | Freq: Every day | ORAL | Status: DC

## 2020-05-12 MED ORDER — DOCUSATE SODIUM 100 MG PO CAPS: 100.0000 mg | ORAL_CAPSULE | Freq: Two times a day (BID) | ORAL | Status: DC | PRN

## 2020-05-12 MED ORDER — POTASSIUM CHLORIDE CRYS ER 20 MEQ PO TBCR
40.0000 meq | EXTENDED_RELEASE_TABLET | Freq: Once | ORAL | Status: AC
  Administered 2020-05-12: 40 meq via ORAL
  Filled 2020-05-12: qty 2

## 2020-05-12 MED ORDER — THIAMINE HCL 100 MG PO TABS: 100.0000 mg | ORAL_TABLET | Freq: Every day | ORAL | Status: DC

## 2020-05-12 NOTE — Progress Notes (Signed)
K 3.6 Electrolytes replaced per protocol

## 2020-05-12 NOTE — Discharge Summary (Signed)
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 twice daily   thiamine 100 MG tablet Take 1 tablet (100 mg total) by mouth daily.       Discharged Condition: good  39 minutes of time have been dedicated to discharge assessment, planning and discharge instructions.   Signed:  Georgann Housekeeper, AGACNP-BC [place]  See Amion for personal pager PCCM on call pager [phone]  05/12/2020 10:17 AM

## 2020-05-12 NOTE — Plan of Care (Signed)
°  Problem: Activity: Goal: Ability to tolerate increased activity will improve Outcome: Adequate for Discharge   Problem: Respiratory: Goal: Ability to maintain a clear airway and adequate ventilation will improve Outcome: Adequate for Discharge   Problem: Role Relationship: Goal: Method of communication will improve Outcome: Adequate for Discharge   Problem: Education: Goal: Knowledge of General Education information will improve Description: Including pain rating scale, medication(s)/side effects and non-pharmacologic comfort measures Outcome: Adequate for Discharge   Problem: Health Behavior/Discharge Planning: Goal: Ability to manage health-related needs will improve Outcome: Adequate for Discharge   Problem: Clinical Measurements: Goal: Ability to maintain clinical measurements within normal limits will improve Outcome: Adequate for Discharge Goal: Will remain free from infection Outcome: Adequate for Discharge Goal: Diagnostic test results will improve Outcome: Adequate for Discharge Goal: Respiratory complications will improve Outcome: Adequate for Discharge Goal: Cardiovascular complication will be avoided Outcome: Adequate for Discharge   Problem: Activity: Goal: Risk for activity intolerance will decrease Outcome: Adequate for Discharge   Problem: Nutrition: Goal: Adequate nutrition will be maintained Outcome: Adequate for Discharge   Problem: Coping: Goal: Level of anxiety will decrease Outcome: Adequate for Discharge   Problem: Elimination: Goal: Will not experience complications related to bowel motility Outcome: Adequate for Discharge Goal: Will not experience complications related to urinary retention Outcome: Adequate for Discharge   Problem: Pain Managment: Goal: General experience of comfort will improve Outcome: Adequate for Discharge   Problem: Safety: Goal: Ability to remain free from injury will improve Outcome: Adequate for Discharge    Problem: Skin Integrity: Goal: Risk for impaired skin integrity will decrease Outcome: Adequate for Discharge   

## 2020-05-12 NOTE — Progress Notes (Addendum)
Pt belongings received from [place] and given to [place] [employer]. Cell phone, charger and ear buds were included in this list. See paper in chart for more details on belongings.  Pt had clothing at the bedside. All clothing had been cut off on admission to remove with the exception of a pair of pants, sock and shoes. Pt wore these at d/c and disposable top was provided to pt. Pt reported he did not want his cut up clothing to trash it. [place] PD present when pt stated this. D/c education provided to pt and [employer]. Printed scripts and d/c paperwork given to officer. Pt was arrested at bedside and escorted out of facility via 2 officers.   Pt was provided with microwave meal prior to leaving.  [employer] reports pt can notify family when he arrives to [place].

## 2020-05-12 NOTE — Plan of Care (Signed)
  Problem: Respiratory: Goal: Ability to maintain a clear airway and adequate ventilation will improve Outcome: Progressing   Problem: Education: Goal: Knowledge of General Education information will improve Description: Including pain rating scale, medication(s)/side effects and non-pharmacologic comfort measures Outcome: Progressing   Problem: Clinical Measurements: Goal: Ability to maintain clinical measurements within normal limits will improve Outcome: Progressing Goal: Will remain free from infection Outcome: Progressing Goal: Diagnostic test results will improve Outcome: Progressing Goal: Respiratory complications will improve Outcome: Progressing Goal: Cardiovascular complication will be avoided Outcome: Progressing   Problem: Nutrition: Goal: Adequate nutrition will be maintained Outcome: Progressing   Problem: Coping: Goal: Level of anxiety will decrease Outcome: Progressing   Problem: Elimination: Goal: Will not experience complications related to bowel motility Outcome: Progressing Goal: Will not experience complications related to urinary retention Outcome: Progressing   Problem: Pain Managment: Goal: General experience of comfort will improve Outcome: Progressing   Problem: Safety: Goal: Ability to remain free from injury will improve Outcome: Progressing   Problem: Skin Integrity: Goal: Risk for impaired skin integrity will decrease Outcome: Progressing

## 2020-05-15 ENCOUNTER — Other Ambulatory Visit: Payer: Self-pay

## 2020-05-15 ENCOUNTER — Ambulatory Visit (HOSPITAL_COMMUNITY)
Admission: EM | Admit: 2020-05-15 | Discharge: 2020-05-16 | Disposition: A | Discharge status: Home / Self Care | Payer: No Payment, Other | Attending: Nurse Practitioner | Admitting: Nurse Practitioner

## 2020-05-15 DIAGNOSIS — F10920 Alcohol use, unspecified with intoxication, uncomplicated: Secondary | ICD-10-CM

## 2020-05-15 DIAGNOSIS — F32A Depression, unspecified: Secondary | ICD-10-CM | POA: Insufficient documentation

## 2020-05-15 DIAGNOSIS — F1024 Alcohol dependence with alcohol-induced mood disorder: Secondary | ICD-10-CM | POA: Insufficient documentation

## 2020-05-15 DIAGNOSIS — F17210 Nicotine dependence, cigarettes, uncomplicated: Secondary | ICD-10-CM | POA: Insufficient documentation

## 2020-05-15 DIAGNOSIS — Z20822 Contact with and (suspected) exposure to covid-19: Secondary | ICD-10-CM | POA: Insufficient documentation

## 2020-05-15 DIAGNOSIS — F1014 Alcohol abuse with alcohol-induced mood disorder: Secondary | ICD-10-CM

## 2020-05-15 LAB — CBC WITH DIFFERENTIAL/PLATELET
Abs Immature Granulocytes: 0.02 10*3/uL (ref 0.00–0.07)
Basophils Absolute: 0 10*3/uL (ref 0.0–0.1)
Basophils Relative: 1 %
Eosinophils Absolute: 0.1 10*3/uL (ref 0.0–0.5)
Eosinophils Relative: 1 %
HCT: 47.9 % (ref 39.0–52.0)
Hemoglobin: 16.8 g/dL (ref 13.0–17.0)
Immature Granulocytes: 0 %
Lymphocytes Relative: 31 %
Lymphs Abs: 2.4 10*3/uL (ref 0.7–4.0)
MCH: 30.5 pg (ref 26.0–34.0)
MCHC: 35.1 g/dL (ref 30.0–36.0)
MCV: 87.1 fL (ref 80.0–100.0)
Monocytes Absolute: 0.4 10*3/uL (ref 0.1–1.0)
Monocytes Relative: 6 %
Neutro Abs: 4.7 10*3/uL (ref 1.7–7.7)
Neutrophils Relative %: 61 %
Platelets: 391 10*3/uL (ref 150–400)
RBC: 5.5 MIL/uL (ref 4.22–5.81)
RDW: 13.1 % (ref 11.5–15.5)
WBC: 7.7 10*3/uL (ref 4.0–10.5)
nRBC: 0 % (ref 0.0–0.2)

## 2020-05-15 LAB — POCT URINE DRUG SCREEN - MANUAL ENTRY (I-SCREEN)
POC Amphetamine UR: NOT DETECTED
POC Buprenorphine (BUP): NOT DETECTED
POC Cocaine UR: NOT DETECTED
POC Marijuana UR: NOT DETECTED
POC Methadone UR: NOT DETECTED
POC Methamphetamine UR: NOT DETECTED
POC Morphine: NOT DETECTED
POC Oxazepam (BZO): NOT DETECTED
POC Oxycodone UR: NOT DETECTED
POC Secobarbital (BAR): NOT DETECTED

## 2020-05-15 LAB — RESP PANEL BY RT-PCR (FLU A&B, COVID) ARPGX2
Influenza A by PCR: NEGATIVE
Influenza B by PCR: NEGATIVE
SARS Coronavirus 2 by RT PCR: NEGATIVE

## 2020-05-15 LAB — POC SARS CORONAVIRUS 2 AG -  ED: SARS Coronavirus 2 Ag: NEGATIVE

## 2020-05-15 MED ORDER — GABAPENTIN 300 MG PO CAPS
300.0000 mg | ORAL_CAPSULE | Freq: Three times a day (TID) | ORAL | Status: DC
  Administered 2020-05-15 – 2020-05-16 (×2): 300 mg via ORAL
  Filled 2020-05-15: qty 21
  Filled 2020-05-15 (×2): qty 1

## 2020-05-15 MED ORDER — GABAPENTIN 600 MG PO TABS: 300.0000 mg | ORAL_TABLET | Freq: Three times a day (TID) | ORAL | Status: DC

## 2020-05-15 MED ORDER — TRAZODONE HCL 50 MG PO TABS
50.0000 mg | ORAL_TABLET | Freq: Every evening | ORAL | Status: DC | PRN
  Administered 2020-05-15: 50 mg via ORAL
  Filled 2020-05-15: qty 1

## 2020-05-15 MED ORDER — MAGNESIUM HYDROXIDE 400 MG/5ML PO SUSP: 30.0000 mL | Freq: Every day | ORAL | Status: DC | PRN

## 2020-05-15 MED ORDER — ACETAMINOPHEN 325 MG PO TABS: 650.0000 mg | ORAL_TABLET | Freq: Four times a day (QID) | ORAL | Status: DC | PRN

## 2020-05-15 MED ORDER — ALUM & MAG HYDROXIDE-SIMETH 200-200-20 MG/5ML PO SUSP: 30.0000 mL | ORAL | Status: DC | PRN

## 2020-05-15 MED ORDER — HYDROXYZINE HCL 25 MG PO TABS: 25.0000 mg | ORAL_TABLET | Freq: Three times a day (TID) | ORAL | Status: DC | PRN

## 2020-05-15 NOTE — ED Notes (Signed)
Pt sleeping@this time. Breathing even and unlabored. Will continue to monitor for safety 

## 2020-05-15 NOTE — ED Notes (Signed)
Patient is drowsy, breathing but not ready to talk to TTS

## 2020-05-15 NOTE — ED Notes (Signed)
PATIENT PHONE, EARPHONES, CIGARETTES, LIGHTER, SWEAT SHIRT STRING, SNEAKERS ARE STORED IN LOCKER [phone]
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
## 2020-05-15 NOTE — ED Provider Notes (Signed)
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 05/11/2020 86.6  80.0 - 100.0 fL Final   MCH 05/11/2020 30.7  26.0 - 34.0 pg Final   MCHC  05/11/2020 35.5  30.0 - 36.0 g/dL Final   RDW 05/11/2020 13.2  11.5 - 15.5 % Final   Platelets 05/11/2020 377  150 - 400 K/uL Final   nRBC 05/11/2020 0.0  0.0 - 0.2 % Final   Performed at [facility] [address], [place], [place] [address]   Creatinine, Ser 05/11/2020 0.88  0.61 - 1.24 mg/dL Final   GFR, Estimated 05/11/2020 >60  >60 mL/min Final   Comment: (NOTE) Calculated using the CKD-EPI Creatinine Equation (2021) Performed at [facility], [place] [address], [place], [place] [address]    WBC 05/11/2020 6.1  4.0 - 10.5 K/uL Final   RBC 05/11/2020 5.27  4.22 - 5.81 MIL/uL Final   Hemoglobin 05/11/2020 16.0  13.0 - 17.0 g/dL Final   HCT 05/11/2020 45.7  39 - 52 % Final   MCV 05/11/2020 86.7  80.0 - 100.0 fL Final   MCH 05/11/2020 30.4  26.0 - 34.0 pg Final   MCHC 05/11/2020 35.0  30.0 - 36.0 g/dL Final   RDW 05/11/2020 13.2  11.5 - 15.5 % Final   Platelets 05/11/2020 391  150 - 400 K/uL Final   nRBC 05/11/2020 0.0  0.0 - 0.2 % Final   Performed at [facility], [place] [address], [place], [place] [address]   Sodium 05/11/2020 144  135 - 145 mmol/L Final   Potassium 05/11/2020 3.2* 3.5 - 5.1 mmol/L Final   Chloride 05/11/2020 112* 98 - 111 mmol/L Final   CO2 05/11/2020 19* 22 - 32 mmol/L Final   Glucose, Bld 05/11/2020 86  70 - 99 mg/dL Final   Glucose reference range applies only to samples taken after fasting for at least 8 hours.   BUN 05/11/2020 <5* 6 - 20 mg/dL Final   Creatinine, Ser 05/11/2020 0.85  0.61 - 1.24 mg/dL Final   Calcium 05/11/2020 8.3* 8.9 - 10.3 mg/dL Final   GFR, Estimated 05/11/2020 >60  >60 mL/min Final   Comment: (NOTE) Calculated using the CKD-EPI Creatinine Equation (2021)    Anion gap 05/11/2020 13  5 - 15 Final   Performed at [facility], [place] [address], [place], [place] [address]   MRSA by PCR 05/11/2020 NEGATIVE  NEGATIVE Final   Comment:        The GeneXpert MRSA Assay (FDA approved for NASAL  specimens only), is one component of a comprehensive MRSA colonization surveillance program. It is not intended to diagnose MRSA infection nor to guide or monitor treatment for MRSA infections. Performed at [facility], [place] [address], [place], [place] [address]    Glucose-Capillary 05/11/2020 106* 70 - 99 mg/dL Final   Glucose reference range applies only to samples taken after fasting for at least 8 hours.   Magnesium 05/11/2020 2.0  1.7 - 2.4 mg/dL Final   Performed at [facility] [address] [place], [place] [address]   Sodium 05/12/2020 142  135 - 145 mmol/L Final   Potassium 05/12/2020 3.6  3.5 - 5.1 mmol/L Final   Chloride 05/12/2020 109  98 - 111 mmol/L Final   CO2 05/12/2020 21* 22 - 32 mmol/L Final   Glucose, Bld 05/12/2020 96  70 - 99 mg/dL Final   Glucose reference range applies only to samples taken after fasting for at least 8 hours.   BUN 05/12/2020 5* 6 - 20 mg/dL Final   Creatinine, Ser 05/12/2020 0.97  0.61 - 1.24 mg/dL Final   Calcium 05/12/2020 8.7* 8.9 - 10.3 mg/dL Final   GFR, Estimated 05/12/2020 >60  >60 mL/min Final   Comment: (NOTE) Calculated using the CKD-EPI Creatinine Equation (2021)    Anion gap 05/12/2020 12  5 - 15 Final   Performed at [facility], [place] [address] [place],  [address]  No results displayed because visit has over 200 results.      Allergies: Patient has no known allergies.  PTA Medications: (Not in a hospital admission)   Medical Decision Making  Admission labs ordered  Initiate CIWA protocol with Ativan 1 mg every 6 hours prn for CIWA >10  Start Gabapentin 300 mg TID for alcohol use disorder/withdrawal    Clinical Course as of May 17 47  [facility] May 15, 2020  2333 CBC unremarkable  CBC with Differential/Platelet [JB]  2334 UDS negative  POCT Urine Drug Screen - (ICup) [JB]  Fri May 16, 2020  0009 BAL 197  Alcohol, Ethyl (B)(!): 197 [JB]  0045 BUN <5, CMP otherwise  unremarkable  BUN(!): <5 [JB]    Clinical Course User Index [JB] Rozetta Nunnery, NP    Recommendations  Based on my evaluation the patient does not appear to have an emergency medical condition.  Rozetta Nunnery, NP 05/16/20  12:48 AM

## 2020-05-15 NOTE — ED Triage Notes (Signed)
Patient states his mom and dad died and it's been about a year and he has been drinking alcohol; denies any other drug use. Patient states he has problems with work because of his drinking and wants help. Patient denies SI/HI. Patient states he hears his father voice doesn't know what he is saying also speaks of seeing his dead dad and sister. Patient is tearful.

## 2020-05-16 LAB — COMPREHENSIVE METABOLIC PANEL
ALT: 14 U/L (ref 0–44)
AST: 20 U/L (ref 15–41)
Albumin: 4.5 g/dL (ref 3.5–5.0)
Alkaline Phosphatase: 49 U/L (ref 38–126)
Anion gap: 10 (ref 5–15)
BUN: <5 mg/dL — ABNORMAL LOW (ref 6–20)
CO2: 26 mmol/L (ref 22–32)
Calcium: 9.2 mg/dL (ref 8.9–10.3)
Chloride: 107 mmol/L (ref 98–111)
Creatinine, Ser: 0.86 mg/dL (ref 0.61–1.24)
GFR, Estimated: >60 mL/min (ref >60)
Glucose, Bld: 76 mg/dL (ref 70–99)
Potassium: 3.9 mmol/L (ref 3.5–5.1)
Sodium: 143 mmol/L (ref 135–145)
Total Bilirubin: 0.6 mg/dL (ref 0.3–1.2)
Total Protein: 7.7 g/dL (ref 6.5–8.1)

## 2020-05-16 LAB — ETHANOL: Alcohol, Ethyl (B): 197 mg/dL — ABNORMAL HIGH (ref <10)

## 2020-05-16 MED ORDER — GABAPENTIN 300 MG PO CAPS: 300.0000 mg | ORAL_CAPSULE | Freq: Three times a day (TID) | ORAL | 0 refills | Status: AC

## 2020-05-16 MED ORDER — LORAZEPAM 1 MG PO TABS: 1.0000 mg | ORAL_TABLET | Freq: Four times a day (QID) | ORAL | Status: DC | PRN

## 2020-05-16 NOTE — ED Notes (Signed)
Pt sleeping@this time. Breathing even and unlabored. Will continue to monitor for safety 

## 2020-05-16 NOTE — ED Provider Notes (Signed)
FBC/OBS ASAP Discharge Summary  Date and Time: 05/16/2020 9:54 AM  Name: Allen Rivas  MRN:  [id number]   Discharge Diagnoses:  Final diagnoses:  Alcohol abuse with alcohol-induced mood disorder ([place])  Alcoholic intoxication without complication ([place])    Subjective: Patient reports this morning that he is feeling better and does not really remember what happened last night. He reports drinking alcohol daily for several days. He denies any suicidal or homicidal ideations and denies nay hallucinations. He reports that he does not remember making any suicidal ideations last night, but he was told that he made those comments. He reports that he wants help with his alcohol use, but [place] and can't go far away, because he needs his job. 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 He reports having housing and needs transportation home.   Stay Summary: Patient is a 24 y.o male that presented to [facility] requesting assistance with his alcohol use. Patient reported binge drinking and when he drinks he has inability to control himself. He presented with a BAL of 197. Patient was admitted to the observation unit for overnight assessment. The patient slept well and was started on Gabapentin 300 mg PO TID. Patient is denying any withdrawal symtoms today and is denying any suicidal or homicidal ideations and denies any hallucinations. He reports that he is ready to go home and needs transportation to his residence. He states taht [place] and needs to keep his job. He is provided with samples and prescriptions of his medications and provided information for CDIOP and open access at [facility].  Total Time spent with patient: 30 minutes  Past Psychiatric History: alcohol dependence Past Medical History: No past medical history on file.  Past Surgical History:  Procedure Laterality Date  . LAPAROTOMY N/A 03/22/2020   Procedure:  EXPLORATORY LAPAROTOMY, SMALL BOWEL RESECTION TIMES TWO.;  Surgeon: Rolm Bookbinder, MD;  Location: [facility];  Service: General;  Laterality: N/A;  . UVULECTOMY     Family History:  Family History  Family history unknown: Yes   Family Psychiatric History: None reported Social History:  Social History   Substance and Sexual Activity  Alcohol Use Yes     Social History   Substance and Sexual Activity  Drug Use Yes    Social History   Socioeconomic History  . Marital status: Single    Spouse name: Not on file  . Number of children: Not on file  . Years of education: Not on file  . Highest education level: Not on file  Occupational History  . Occupation: Not employed  Tobacco Use  . Smoking status: Current Every Day Smoker    Packs/day: 0.15    Types: Cigarettes  . Smokeless tobacco: Never Used  Vaping Use  . Vaping Use: Never used  Substance and Sexual Activity  . Alcohol use: Yes  . Drug use: Yes  . Sexual activity: Not Currently  Other Topics Concern  . Not on file  Social History Narrative   ** Merged History Encounter **       Originally from [place]. Left home country after his parents were shot and killed in his presence. Arrived in [place] with sister and friend via [place].    Social Determinants of Health   Financial Resource Strain:   . Difficulty of Paying Living Expenses: Not on file  Food Insecurity:   . Worried About Charity fundraiser in  the Last Year: Not on file  . Ran Out of Food in the Last Year: Not on file  Transportation Needs:   . Lack of Transportation (Medical): Not on file  . Lack of Transportation (Non-Medical): Not on file  Physical Activity:   . Days of Exercise per Week: Not on file  . Minutes of Exercise per Session: Not on file  Stress:   . Feeling of Stress : Not on file  Social Connections:   . Frequency of Communication with Friends and Family: Not on file  . Frequency of Social Gatherings with Friends and Family: Not on file  .  Attends Religious Services: Not on file  . Active Member of Clubs or Organizations: Not on file  . Attends Archivist Meetings: Not on file  . Marital Status: Not on file   SDOH:  SDOH Screenings   Alcohol Screen:   . Last Alcohol Screening Score (AUDIT): Not on file  Depression (PHQ2-9):   . PHQ-2 Score: Not on file  Financial Resource Strain:   . Difficulty of Paying Living Expenses: Not on file  Food Insecurity:   . Worried About Charity fundraiser in the Last Year: Not on file  . Ran Out of Food in the Last Year: Not on file  Housing:   . Last Housing Risk Score: Not on file  Physical Activity:   . Days of Exercise per Week: Not on file  . Minutes of Exercise per Session: Not on file  Social Connections:   . Frequency of Communication with Friends and Family: Not on file  . Frequency of Social Gatherings with Friends and Family: Not on file  . Attends Religious Services: Not on file  . Active Member of Clubs or Organizations: Not on file  . Attends Archivist Meetings: Not on file  . Marital Status: Not on file  Stress:   . Feeling of Stress : Not on file  Tobacco Use: High Risk  . Smoking Tobacco Use: Current Every Day Smoker  . Smokeless Tobacco Use: Never Used  Transportation Needs:   . Film/video editor (Medical): Not on file  . Lack of Transportation (Non-Medical): Not on file    Has this patient used any form of tobacco in the last 30 days? (Cigarettes, Smokeless Tobacco, Cigars, and/or Pipes) A prescription for an FDA-approved tobacco cessation medication was offered at discharge and the patient refused  Current Medications:  Current Facility-Administered Medications  Medication Dose Route Frequency Provider Last Rate Last Admin  . acetaminophen (TYLENOL) tablet 650 mg  650 mg Oral Q6H PRN Rozetta Nunnery, NP      . alum & mag hydroxide-simeth (MAALOX/MYLANTA) 200-200-20 MG/5ML suspension 30 mL  30 mL Oral Q4H PRN Lindon Romp A, NP       . gabapentin (NEURONTIN) capsule 300 mg  300 mg Oral TID Lindon Romp A, NP   300 mg at 05/16/20 8101  . hydrOXYzine (ATARAX/VISTARIL) tablet 25 mg  25 mg Oral TID PRN Rozetta Nunnery, NP      . LORazepam (ATIVAN) tablet 1 mg  1 mg Oral Q6H PRN Lindon Romp A, NP      . magnesium hydroxide (MILK OF MAGNESIA) suspension 30 mL  30 mL Oral Daily PRN Lindon Romp A, NP      . traZODone (DESYREL) tablet 50 mg  50 mg Oral QHS PRN Rozetta Nunnery, NP   50 mg at 05/15/20 2149   No current outpatient medications  on file.    PTA Medications: (Not in a hospital admission)   Musculoskeletal  Strength & Muscle Tone: within normal limits Gait & Station: normal Patient leans: N/A  Psychiatric Specialty Exam  Presentation  General Appearance: Appropriate for Environment;Casual  Eye Contact:Good  Speech:Clear and Coherent;Normal Rate  Speech Volume:Normal  Handedness:Right   Mood and Affect  Mood:Euthymic  Affect:Appropriate;Congruent   Thought Process  Thought Processes:Coherent  Descriptions of Associations:Intact  Orientation:Full (Time, Place and Person)  Thought Content:WDL  Hallucinations:Hallucinations: None Description of Auditory Hallucinations: reports hearing the voice of his deceased father  Ideas of Reference:None  Suicidal Thoughts:Suicidal Thoughts: No  Homicidal Thoughts:Homicidal Thoughts: No   Sensorium  Memory:Immediate Good;Recent Good;Remote Good  Judgment:Fair  Insight:Fair   Executive Functions  Concentration:Good  Attention Span:Good  [place] of Knowledge:Good  Language:Good   Psychomotor Activity  Psychomotor Activity:Psychomotor Activity: Normal   Assets  Assets:Communication Skills;Desire for Improvement;Housing;Physical Health;Social Support;Transportation   Sleep  Sleep:Sleep: Good   Physical Exam  Physical Exam ROS Blood pressure 121/82, pulse 83, temperature 98.3 F (36.8 C), resp. rate 16, height 5\' 8"   (1.727 m), weight 128 lb (58.1 kg), SpO2 100 %. Body mass index is 19.46 kg/m.  Demographic Factors:  Male  Loss Factors: NA  Historical Factors: NA  Risk Reduction Factors:   Employed, Living with another person, especially a relative and Positive social support  Continued Clinical Symptoms:  Alcohol/Substance Abuse/Dependencies  Cognitive Features That Contribute To Risk:  None    Suicide Risk:  Minimal: No identifiable suicidal ideation.  Patients presenting with no risk factors but with morbid ruminations; may be classified as minimal risk based on the severity of the depressive symptoms  Plan Of Care/Follow-up recommendations:  Continue activity as tolerated. Continue diet as recommended by your PCP. Ensure to keep all appointments with outpatient providers.  Disposition: Discharge home  Lewis Shock, [place] 05/16/2020, 9:54 AM

## 2020-05-16 NOTE — ED Notes (Signed)
Escorted to retrieve belongs. Ambulated per self with no issues. A&O x4. No SI, HI, or AVH. Escorted to back sallyport and out door to safe transport. Stable at time of d/c.

## 2020-05-16 NOTE — Discharge Instructions (Addendum)

## 2020-05-16 NOTE — ED Notes (Signed)
Pt sleeping@this  time. Breathing even and unlabored. No c/o of pain or distress. Will continue to monitor for safety

## 2020-05-16 NOTE — ED Notes (Signed)
Pt currently sleeping. Safety maintained. Will continue to monitor.  

## 2020-05-20 ENCOUNTER — Encounter (HOSPITAL_COMMUNITY): Payer: Self-pay | Admitting: Student

## 2020-05-20 ENCOUNTER — Emergency Department (HOSPITAL_COMMUNITY): Payer: Self-pay

## 2020-05-20 ENCOUNTER — Other Ambulatory Visit: Payer: Self-pay

## 2020-05-20 ENCOUNTER — Emergency Department (HOSPITAL_COMMUNITY)
Admission: EM | Admit: 2020-05-20 | Discharge: 2020-05-20 | Disposition: A | Discharge status: Home / Self Care | Payer: Self-pay | Attending: Emergency Medicine | Admitting: Emergency Medicine

## 2020-05-20 DIAGNOSIS — Y33XXXA Other specified events, undetermined intent, initial encounter: Secondary | ICD-10-CM | POA: Insufficient documentation

## 2020-05-20 DIAGNOSIS — R4182 Altered mental status, unspecified: Secondary | ICD-10-CM | POA: Insufficient documentation

## 2020-05-20 DIAGNOSIS — F17210 Nicotine dependence, cigarettes, uncomplicated: Secondary | ICD-10-CM | POA: Insufficient documentation

## 2020-05-20 DIAGNOSIS — F10920 Alcohol use, unspecified with intoxication, uncomplicated: Secondary | ICD-10-CM | POA: Insufficient documentation

## 2020-05-20 DIAGNOSIS — S0993XA Unspecified injury of face, initial encounter: Secondary | ICD-10-CM | POA: Insufficient documentation

## 2020-05-20 LAB — COMPREHENSIVE METABOLIC PANEL
ALT: 10 U/L (ref 0–44)
AST: 18 U/L (ref 15–41)
Albumin: 4.7 g/dL (ref 3.5–5.0)
Alkaline Phosphatase: 40 U/L (ref 38–126)
Anion gap: 13 (ref 5–15)
BUN: 5 mg/dL — ABNORMAL LOW (ref 6–20)
CO2: 21 mmol/L — ABNORMAL LOW (ref 22–32)
Calcium: 8.8 mg/dL — ABNORMAL LOW (ref 8.9–10.3)
Chloride: 109 mmol/L (ref 98–111)
Creatinine, Ser: 0.73 mg/dL (ref 0.61–1.24)
GFR, Estimated: >60 mL/min (ref >60)
Glucose, Bld: 94 mg/dL (ref 70–99)
Potassium: 3.8 mmol/L (ref 3.5–5.1)
Sodium: 143 mmol/L (ref 135–145)
Total Bilirubin: 0.3 mg/dL (ref 0.3–1.2)
Total Protein: 7.9 g/dL (ref 6.5–8.1)

## 2020-05-20 LAB — CBC WITH DIFFERENTIAL/PLATELET
Abs Immature Granulocytes: 0.02 10*3/uL (ref 0.00–0.07)
Basophils Absolute: 0 10*3/uL (ref 0.0–0.1)
Basophils Relative: 1 %
Eosinophils Absolute: 0.1 10*3/uL (ref 0.0–0.5)
Eosinophils Relative: 1 %
HCT: 48.5 % (ref 39.0–52.0)
Hemoglobin: 17 g/dL (ref 13.0–17.0)
Immature Granulocytes: 0 %
Lymphocytes Relative: 37 %
Lymphs Abs: 2.3 10*3/uL (ref 0.7–4.0)
MCH: 30.5 pg (ref 26.0–34.0)
MCHC: 35.1 g/dL (ref 30.0–36.0)
MCV: 87.1 fL (ref 80.0–100.0)
Monocytes Absolute: 0.3 10*3/uL (ref 0.1–1.0)
Monocytes Relative: 5 %
Neutro Abs: 3.5 10*3/uL (ref 1.7–7.7)
Neutrophils Relative %: 56 %
Platelets: 335 10*3/uL (ref 150–400)
RBC: 5.57 MIL/uL (ref 4.22–5.81)
RDW: 13.2 % (ref 11.5–15.5)
WBC: 6.2 10*3/uL (ref 4.0–10.5)
nRBC: 0 % (ref 0.0–0.2)

## 2020-05-20 LAB — RAPID URINE DRUG SCREEN, HOSP PERFORMED
Amphetamines: NOT DETECTED
Barbiturates: NOT DETECTED
Benzodiazepines: NOT DETECTED
Cocaine: NOT DETECTED
Opiates: NOT DETECTED
Tetrahydrocannabinol: NOT DETECTED

## 2020-05-20 LAB — URINALYSIS, ROUTINE W REFLEX MICROSCOPIC
Bilirubin Urine: NEGATIVE
Glucose, UA: NEGATIVE mg/dL
Hgb urine dipstick: NEGATIVE
Ketones, ur: NEGATIVE mg/dL
Leukocytes,Ua: NEGATIVE
Nitrite: NEGATIVE
Protein, ur: NEGATIVE mg/dL
Specific Gravity, Urine: 1.002 — ABNORMAL LOW (ref 1.005–1.030)
pH: 6 (ref 5.0–8.0)

## 2020-05-20 LAB — ETHANOL: Alcohol, Ethyl (B): 333 mg/dL (ref <10)

## 2020-05-20 NOTE — ED Notes (Signed)
Patient transported to CT 

## 2020-05-20 NOTE — ED Notes (Signed)
CRITICAL VALUE ALERT  Critical Value:  Ethanol 333  Date & Time Notied:  05/20/2020 1902  Provider Notified: Roslynn Amble, EDP  Orders Received/Actions taken: acknowledged

## 2020-05-20 NOTE — ED Notes (Signed)
Patient yelling and cussing at this nurse, security call to the room.  Patient spitting blood at staff.  Patient ripped off leads, BP cuff, and oxygen sensor.  Roslynn Amble, EDP at bedside with security.
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
## 2020-05-20 NOTE — Discharge Instructions (Signed)
You been seen here for alcohol intoxication and teeth avulsion.  I recommend over-the-counter pain medications like ibuprofen or Tylenol every 6 hours as needed please follow dosing the back of bottle.    I recommend following up with a dentist for further evaluation of your teeth.  I have also given you resources for outpatient substance abuse.  Please review  Come back to the emergency department if you develop chest pain, shortness of breath, severe abdominal pain, uncontrolled nausea, vomiting, diarrhea.

## 2020-05-20 NOTE — ED Notes (Signed)
Pt removed cardiac monitoring and all cables. Dr present to assess patient. Pt ambulatory. Left prior to giving discharge papers. Pt removed own IV.

## 2020-05-20 NOTE — ED Notes (Addendum)
Patient walked to CT but then became unresponsive again during CT.  Patient brought back to the room and Dykstra, EDP and Will, PA at bedside to assess patient.  Vitals are otherwise stable.
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
## 2020-05-21 ENCOUNTER — Telehealth (HOSPITAL_COMMUNITY): Payer: Self-pay | Admitting: General Practice

## 2020-05-21 NOTE — Telephone Encounter (Signed)
Care Management - Follow Up [facility] Discharges   Writer attempted to make contact with patient today and was unsuccessful.  Writer was able to leave a HIPPA compliant voice message and will await callback.   

## 2020-05-27 ENCOUNTER — Emergency Department (HOSPITAL_COMMUNITY)
Admission: EM | Admit: 2020-05-27 | Discharge: 2020-05-27 | Disposition: A | Discharge status: Home / Self Care | Payer: Medicaid Other | Attending: Emergency Medicine | Admitting: Emergency Medicine

## 2020-05-27 ENCOUNTER — Emergency Department (HOSPITAL_COMMUNITY)
Admission: EM | Admit: 2020-05-27 | Discharge: 2020-05-27 | Disposition: A | Discharge status: Home / Self Care | Payer: Self-pay | Attending: Emergency Medicine | Admitting: Emergency Medicine

## 2020-05-27 ENCOUNTER — Other Ambulatory Visit: Payer: Self-pay

## 2020-05-27 DIAGNOSIS — Z760 Encounter for issue of repeat prescription: Secondary | ICD-10-CM | POA: Insufficient documentation

## 2020-05-27 DIAGNOSIS — Z5321 Procedure and treatment not carried out due to patient leaving prior to being seen by health care provider: Secondary | ICD-10-CM | POA: Insufficient documentation

## 2020-05-27 NOTE — ED Notes (Signed)
Called Pt multiple times for vitals recheck and no answer

## 2020-05-27 NOTE — ED Triage Notes (Signed)
Pt checked in for same earlier, but sts he just fell asleep so he did not hear his name. Reports being out of his behavioral medication as of today and needs refills. Unable to provide the names of medication he takes.

## 2020-05-27 NOTE — ED Triage Notes (Signed)
Pt reports being out of his behavioral medication today and needing a refill.

## 2020-05-29 ENCOUNTER — Other Ambulatory Visit: Payer: Self-pay

## 2020-05-29 ENCOUNTER — Emergency Department (HOSPITAL_COMMUNITY): Payer: Medicaid Other

## 2020-05-29 ENCOUNTER — Emergency Department (HOSPITAL_COMMUNITY)
Admission: EM | Admit: 2020-05-29 | Discharge: 2020-05-29 | Disposition: A | Discharge status: Home / Self Care | Payer: Medicaid Other | Attending: Emergency Medicine | Admitting: Emergency Medicine

## 2020-05-29 ENCOUNTER — Encounter (HOSPITAL_COMMUNITY): Payer: Self-pay

## 2020-05-29 DIAGNOSIS — H539 Unspecified visual disturbance: Secondary | ICD-10-CM | POA: Insufficient documentation

## 2020-05-29 DIAGNOSIS — S80212A Abrasion, left knee, initial encounter: Secondary | ICD-10-CM | POA: Insufficient documentation

## 2020-05-29 DIAGNOSIS — X58XXXA Exposure to other specified factors, initial encounter: Secondary | ICD-10-CM | POA: Insufficient documentation

## 2020-05-29 DIAGNOSIS — S0081XA Abrasion of other part of head, initial encounter: Secondary | ICD-10-CM | POA: Insufficient documentation

## 2020-05-29 DIAGNOSIS — T07XXXA Unspecified multiple injuries, initial encounter: Secondary | ICD-10-CM

## 2020-05-29 DIAGNOSIS — F17210 Nicotine dependence, cigarettes, uncomplicated: Secondary | ICD-10-CM | POA: Insufficient documentation

## 2020-05-29 MED ORDER — BACITRACIN-NEOMYCIN-POLYMYXIN 400-5-5000 EX OINT
TOPICAL_OINTMENT | Freq: Once | CUTANEOUS | Status: AC
  Administered 2020-05-29: 1 via TOPICAL
  Filled 2020-05-29: qty 1

## 2020-05-29 NOTE — ED Notes (Signed)
Patient transported to CT 

## 2020-05-29 NOTE — Discharge Instructions (Addendum)
Today's evaluation has been reassuring.  There is no evidence for facial or skull fractures.  There is no evidence for intracranial bleeding or damage. If your symptoms persist, or you develop new, or concerning changes, be sure to return here or follow-up with your physician or with our eye care specialist.
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
## 2020-05-29 NOTE — ED Notes (Signed)
Pt abrasions have been clean and bandaged at bedside. See [facility]

## 2020-05-29 NOTE — ED Notes (Signed)
ED Provider at bedside. 

## 2020-05-29 NOTE — ED Notes (Signed)
Initial contact with pt, pt is in police custody. C/o pain in the head from abrasion during arrest in addition to left eye pain and difficulty seeing.

## 2020-05-29 NOTE — ED Notes (Signed)
CT tech at pt bedside

## 2020-06-11 ENCOUNTER — Encounter: Payer: Self-pay | Admitting: Critical Care Medicine

## 2020-06-11 IMAGING — CT CT HEAD WITHOUT CONTRAST
4 series · 17 of 47 positions shown, 19 images · non-contrast
Comparison: None.

CLINICAL DATA: Altered mental status

EXAM:
CT HEAD WITHOUT CONTRAST
TECHNIQUE: Contiguous axial images were obtained from the base of the skull
through the vertex without intravenous contrast.

[Series 3: head wo · axial · 0.47mm/px · z∈[-168,-38]mm · 7 of 36 slices shown, 9 images]
[im 5/36  brain]
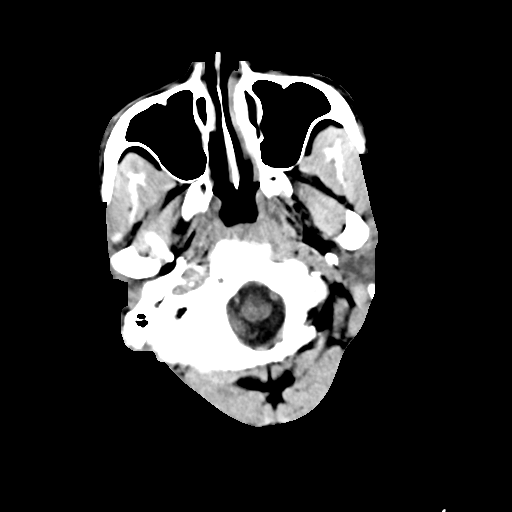
[im 5/36  bone]
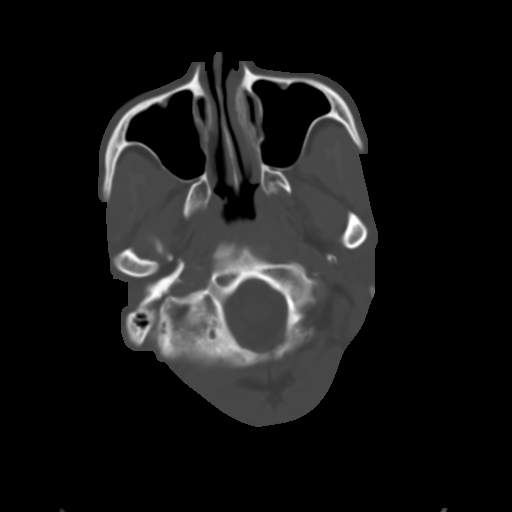
[im 9/36  brain]
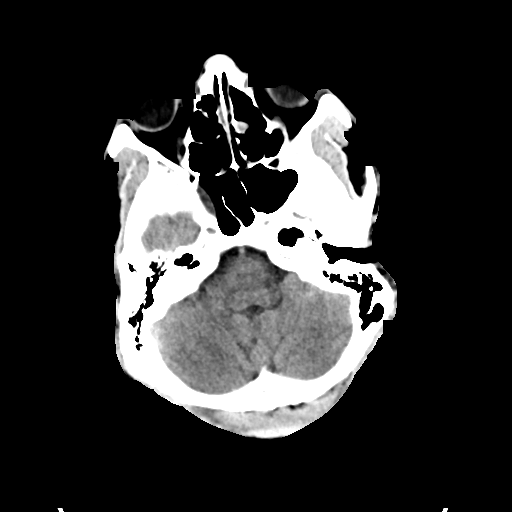
[im 14/36  brain]
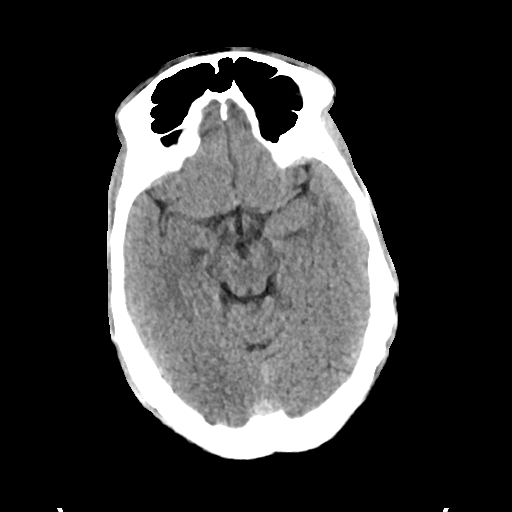
[im 18/36  brain]
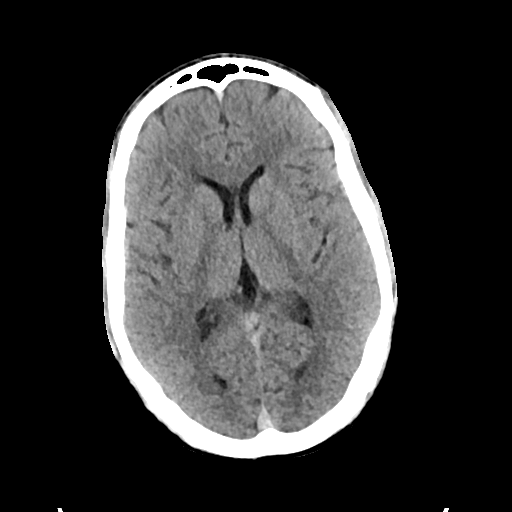
[im 22/36  brain]
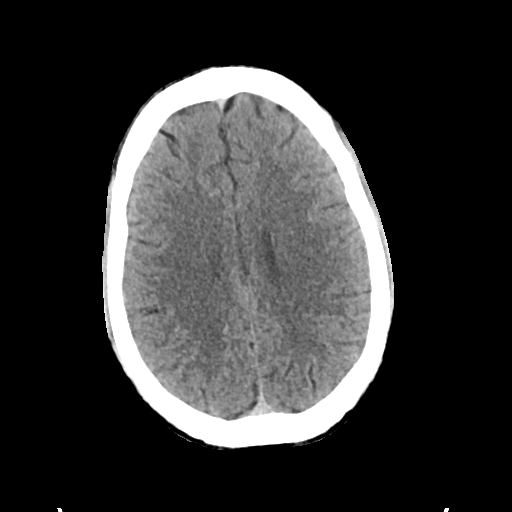
[im 22/36  bone]
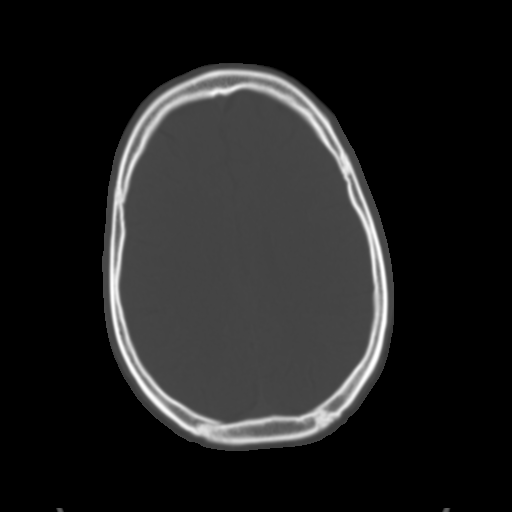
[im 27/36  brain]
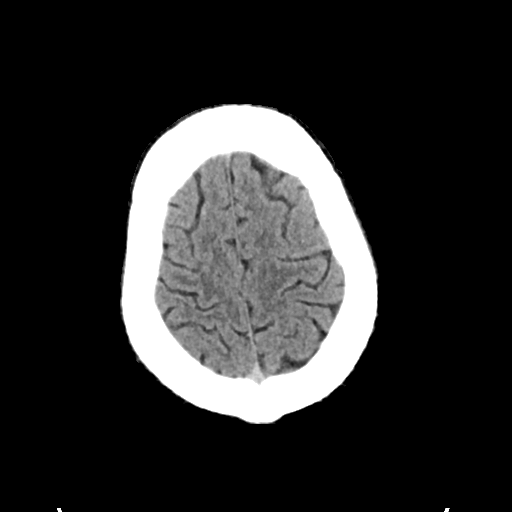
[im 31/36  brain]
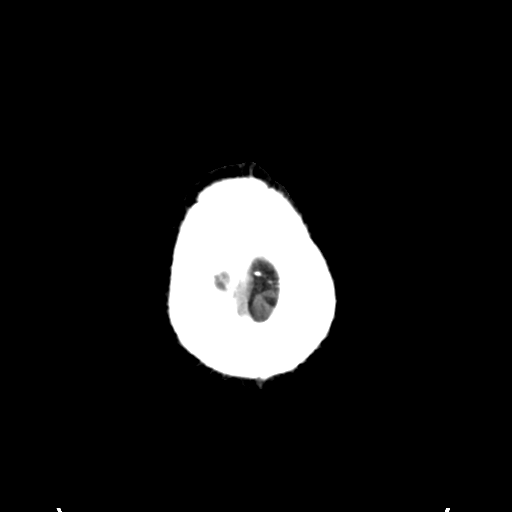

[Series 4: head bone · axial · 0.47mm/px · z∈[-172,-110]mm · 4 of 89 slices shown]
[im 9/89  bone]
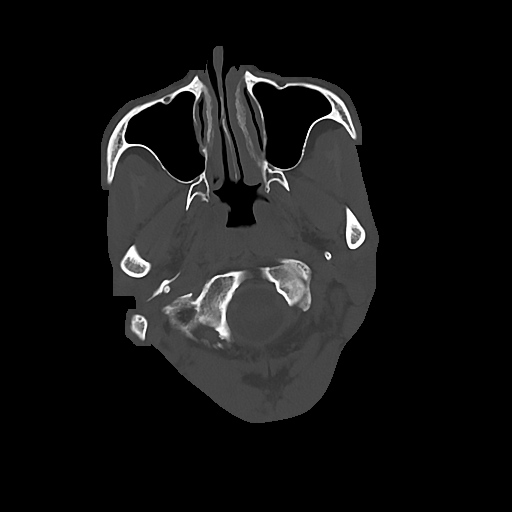
[im 18/89  bone]
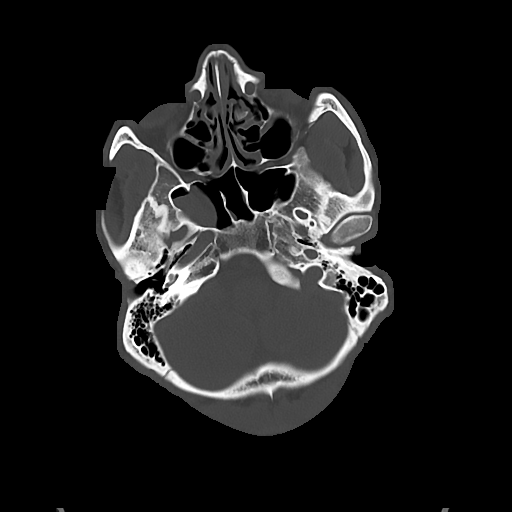
[im 27/89  bone]
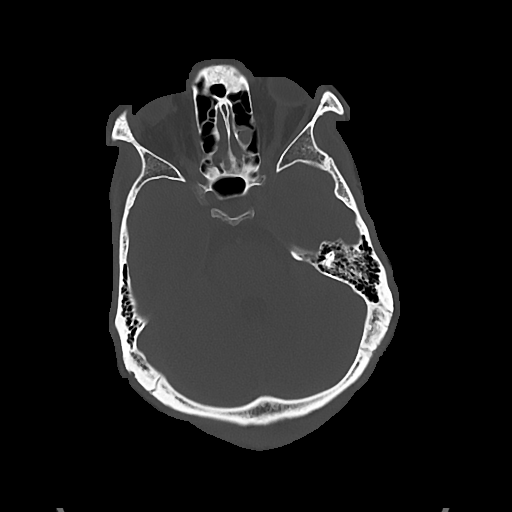
[im 40/89  bone]
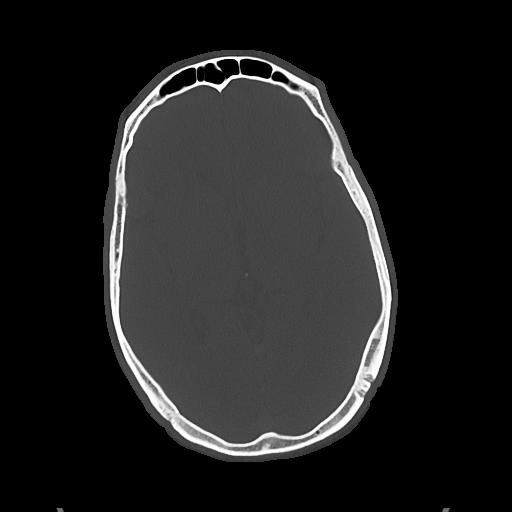

[Series 5: cor soft · coronal · 0.35mm/px · 3 of 75 slices shown]
[im 25/75  brain]
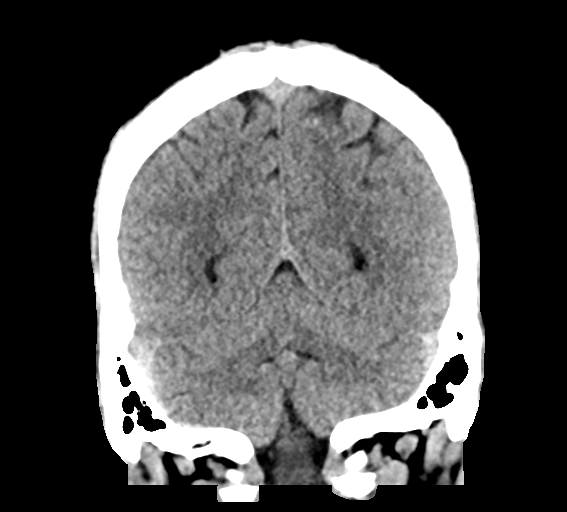
[im 33/75  brain]
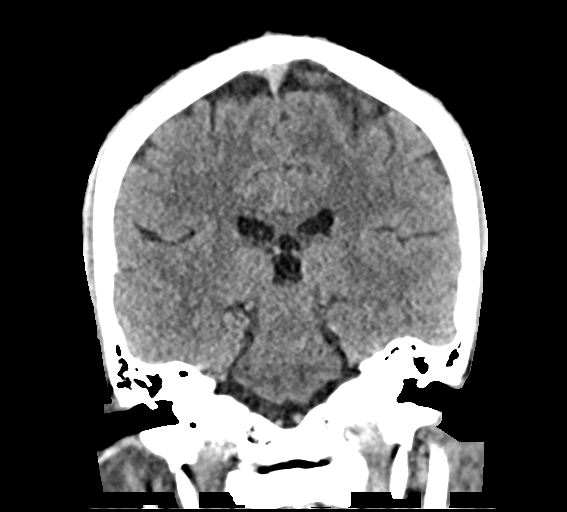
[im 42/75  brain]
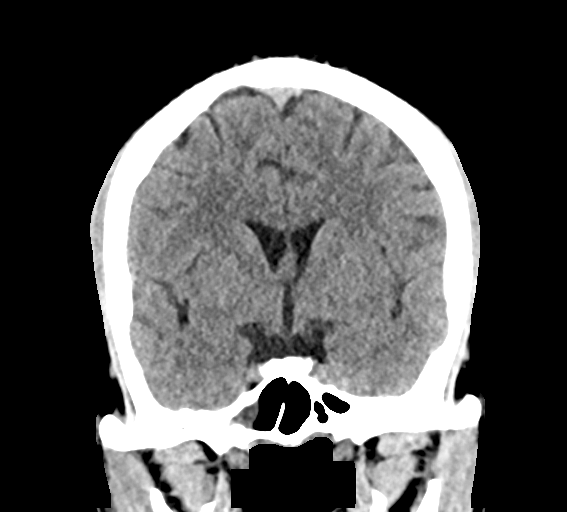

[Series 6: sag soft · sagittal · 0.35mm/px · 3 of 66 slices shown]
[im 22/66  brain]
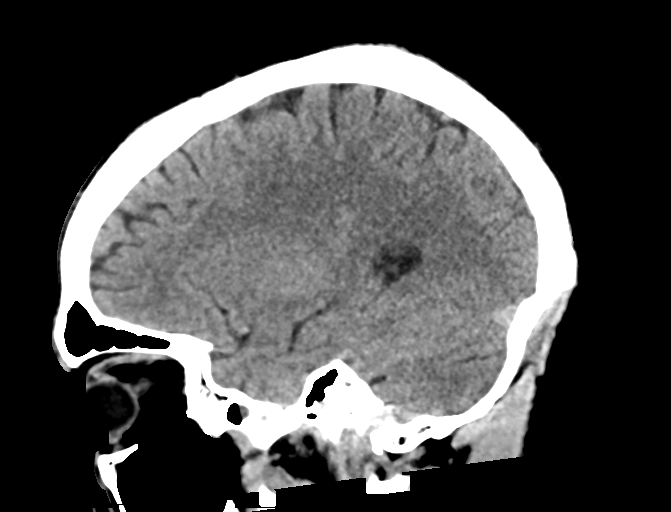
[im 33/66  brain]
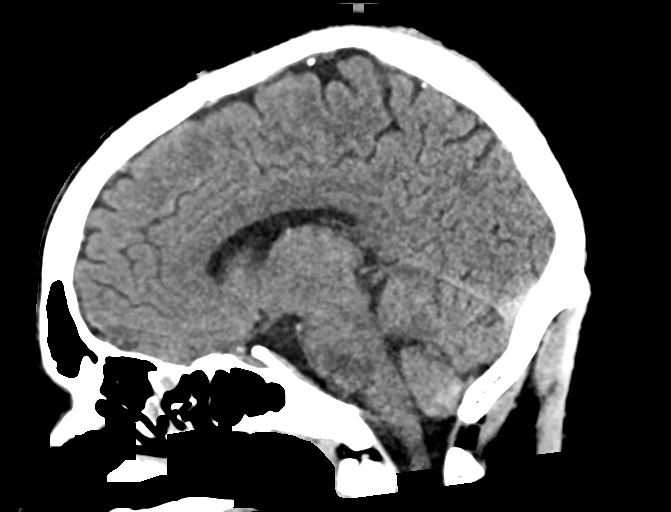
[im 44/66  brain]
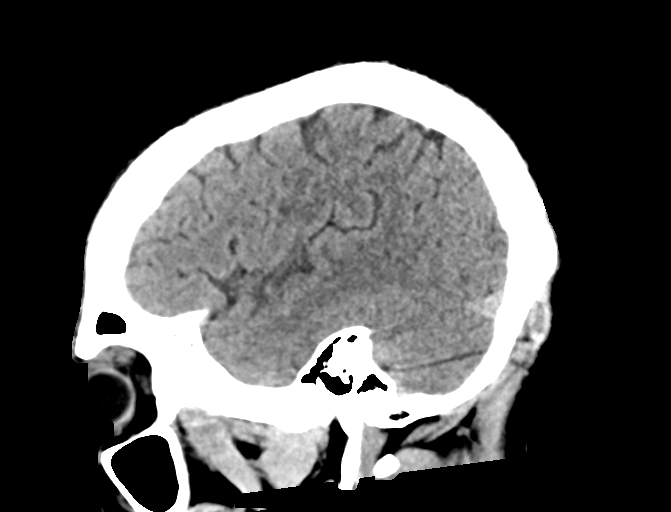

[17 of 47 positions shown; findings below may reference images not displayed]

FINDINGS: Brain: No evidence of acute infarction, hemorrhage, hydrocephalus,
extra-axial collection or mass lesion/mass effect.

Vascular: No hyperdense vessel or unexpected calcification.

Skull: Normal. Negative for fracture or focal lesion.

Sinuses/Orbits: Mild mucosal thickening of the ethmoid air cells.

Other: None.
IMPRESSION: No acute intracranial pathology.
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
## 2020-06-12 ENCOUNTER — Ambulatory Visit (HOSPITAL_COMMUNITY)
Admission: EM | Admit: 2020-06-12 | Discharge: 2020-06-12 | Disposition: A | Discharge status: Home / Self Care | Payer: No Payment, Other | Attending: Family | Admitting: Family

## 2020-06-12 ENCOUNTER — Other Ambulatory Visit: Payer: Self-pay

## 2020-06-12 DIAGNOSIS — Z7983 Long term (current) use of bisphosphonates: Secondary | ICD-10-CM | POA: Diagnosis not present

## 2020-06-12 DIAGNOSIS — Z91120 Patient's intentional underdosing of medication regimen due to financial hardship: Secondary | ICD-10-CM | POA: Insufficient documentation

## 2020-06-12 DIAGNOSIS — T43596D Underdosing of other antipsychotics and neuroleptics, subsequent encounter: Secondary | ICD-10-CM | POA: Insufficient documentation

## 2020-06-12 DIAGNOSIS — Z79899 Other long term (current) drug therapy: Secondary | ICD-10-CM | POA: Insufficient documentation

## 2020-06-12 DIAGNOSIS — F1020 Alcohol dependence, uncomplicated: Secondary | ICD-10-CM | POA: Diagnosis not present

## 2020-06-12 MED ORDER — TRAZODONE HCL 50 MG PO TABS: 50.0000 mg | ORAL_TABLET | Freq: Every day | ORAL | Status: DC

## 2020-06-12 MED ORDER — TRAZODONE HCL 50 MG PO TABS
50.0000 mg | ORAL_TABLET | Freq: Every evening | ORAL | Status: DC | PRN
  Filled 2020-06-12: qty 7

## 2020-06-12 MED ORDER — TRAZODONE HCL 50 MG PO TABS: 50.0000 mg | ORAL_TABLET | Freq: Every evening | ORAL | 0 refills | Status: AC | PRN

## 2020-06-12 MED ORDER — GABAPENTIN 300 MG PO CAPS
300.0000 mg | ORAL_CAPSULE | Freq: Three times a day (TID) | ORAL | Status: DC
  Filled 2020-06-12: qty 21

## 2020-06-12 NOTE — ED Triage Notes (Signed)
Patient states he needs help with sleep and alcohol. He states he drinks cause he cannot sleep. He is hearing voices and see faces of those lost in war. 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 Patient denies SI/HI. Patient appetite is decreased. Patient states his concentration is fair.
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
## 2020-06-12 NOTE — ED Notes (Signed)
[facility] #[phone]  

## 2020-06-12 NOTE — BH Assessment (Signed)
Comprehensive Clinical Assessment (CCA) Note  06/12/2020 Allen Rivas [id number]   Visit Diagnosis: PTSD; MDD, recurrent, severe without sx of psychosis  Disposition: Allen Libra, NP recommends pt follow up with [facility] outpt tx for counseling & med mngt; Pt also given resources for Substance Abuse tx facilities   Allen Rivas is a single 24 yo male who presents voluntarily to [facility] for a walk-in assessment. Pt was previously seen here on 05/15/2020. He is currently reporting symptoms of depression. Pt reports medication compliance with meds given at d/c from [facility] at previous visit, and states he has now run out.   Pt current suicidal ideation He denies suicide plan and past suicide attempts. He acknowledges multiple symptoms of Depression, including anhedonia, isolating, feelings of worthlessness & guilt, tearfulness, changes in sleep & appetite, & increased irritability. Pt denies homicidal ideation. 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 Pt moved to the [place] at 23 yo. Pt reports hearing bullets and his friends voices, especially when trying to sleep. No psychosis or delusions noted. Pt states current stressors include financial, legal, lack of support and homelessness.   Pt denies hx of abuse growing up in his home. Pt's work history includes a job at the [employer]. He states he quit the job when he had to have stomach surgery. Pt has partial insight and judgment. Pt's memory is intact. Legal history includes court date 06/25/19- DWI, resisting arrest and having an open container.  Protective factors against suicide include no current suicidal ideation, tno access to firearms, no current psychotic symptoms and no prior attempts.?  Pt's OP & IP tx history includes none.  Pt reports alcohol use of a 12 pack of beer daily. ? MSE: Pt is casually dressed, alert, oriented x 5 with normal speech and  normal motor behavior. Eye contact is good. Pt's mood is depressed and affect is depressed. Affect is congruent with mood. Thought process is coherent and relevant. There is no indication pt is currently responding to internal stimuli or experiencing delusional thought content. Pt was cooperative throughout assessment.    04/2520 Assessment note by Allen Rivas: Allen Rivas is a 24 year old male presenting voluntarily as a walk-in at [facility], brought in by police due to alcoholism. Patient was intoxicated during assessment. Patient was difficult to arouse, as mental health tech, tried several times and patient would not initially awaken or engage in assessment. Patient denied SI, HI and psychosis. Patient reported worsening depressive symptoms. Patient mentioned, negative past events, however patient was not able to articulate details. Patient reported mom and dad died within the past year and that he has begun drinking alcohol. Patient reported hearing his dads voice and feels that its in his mind and are not hallucinations. Patient also reported work-related stressors. However patient then stated "I feel like I can kill myself but I can't, I am a Christian, I can't handle it". Patient continually stated "I need help, I am here for help". Patient was not forthcoming with information, continually stating "I have no peace in my mind". Patient reported pending legal charges, when asked what they were patient was unable to recall. Patient reported poor sleep, not sleeping in days and poor appetite due to broken teeth.   Patient reported taking psych medications, but unclear regarding who prescribes him psych medications. Patient reported living alone and that his sister lives in same city as him and his  brother lives in in [place]. Patient reported poor support system "I have no one with me here to while I go through these things, my brother would help, but he is in [place]". Patient is currently employed. Patient  denied access to guns. Patient was unable to answer assessment questions at times due to intoxication.    Disposition: Allen Libra, NP recommends pt follow up with [facility] outpt tx for counseling & med mngt; Pt also given resources for Substance Abuse tx facilities   Chief Complaint:  Chief Complaint  Patient presents with  . Depression  . Addiction Problem  . Post-Traumatic Stress Disorder    CCA Screening, Triage and Referral (STR)  Patient Reported Information How did you hear about [place]? Family/Friend (Phreesia 06/12/2020)  Referral name: Allen Rivas [facility] 06/12/2020)  Referral phone number: No data recorded  Whom do you see for routine medical problems? Primary Care (Phreesia 06/12/2020)  Practice/Facility Name: [name] ([place] 06/12/2020)  Practice/Facility Phone Number: No data recorded Name of Contact: NA (Phreesia 06/12/2020)  Contact Number: No data recorded Contact Fax Number: No data recorded Prescriber Name: Na ([place] 06/12/2020)  Prescriber Address (if known): Na ([place] 06/12/2020)   What Is the Reason for Your Visit/Call Today? I Need Help (Phreesia 06/12/2020)  How Long Has This Been Causing You Problems? > than 6 months (Phreesia 06/12/2020)  What Do You Feel Would Help You the Most Today? Assessment Only (Phreesia 06/12/2020)   Have You Recently Been in Any Inpatient Treatment (Hospital/Detox/Crisis Center/28-Day Program)? Yes (Phreesia 06/12/2020)  Name/Location of Program/Hospital:[facility] (Phreesia 06/12/2020)  How Long Were You There? 1day (Phreesia 06/12/2020)  When Were You Discharged? No data recorded  Have You Ever Received Services From [facility] Before? Yes (Phreesia 06/12/2020)  Who Do You See at [facility]? emergency ([place] 06/12/2020)   Have You Recently Had Any Thoughts About Hurting Yourself? No (Phreesia 06/12/2020)  Are You Planning to Commit Suicide/Harm Yourself At This time? No (Phreesia  06/12/2020)   Have you Recently Had Thoughts About [place]? No (Phreesia 06/12/2020)  Explanation: No data recorded  Have You Used Any Alcohol or Drugs in the Past 24 Hours? No (Phreesia 06/12/2020)  How Long Ago Did You Use Drugs or Alcohol? 0000 (this morning)  What Did You Use and How Much? "alcohol, not sure"   Do You Currently Have a Therapist/Psychiatrist? No (Phreesia 06/12/2020)  Name of Therapist/Psychiatrist: No data recorded  Have You Been Recently Discharged From Any Office Practice or Programs? No (Phreesia 06/12/2020)  Explanation of Discharge From Practice/Program: No data recorded    CCA Screening Triage Referral Assessment Type of Contact: Face-to-Face   Patient Reported Information Reviewed? Yes  Collateral Involvement: none reported  Is CPS involved or ever been involved? Never  Is APS involved or ever been involved? Never   Patient Determined To Be At Risk for Harm To Self or Others Based on Review of Patient Reported Information or Presenting Complaint? No   [facility] of Assessment: [facility]   Does Patient Present under Involuntary Commitment? No   [place] of Residence: [place]   Patient Currently Receiving the Following Services: Not Receiving Services   Determination of Need: Urgent (48 hours)   Options For Referral: Medication Management; Chemical Dependency Intensive Outpatient Therapy (CDIOP)   CCA Biopsychosocial Intake/Chief Complaint:  alcohol abuse and PTSD from government enforced fighting in [place].  Current Symptoms/Problems: depression sx; not sleeping   Patient Reported Schizophrenia/Schizoaffective Diagnosis in Past: No   Strengths:  intelligent, resilient  Preferences: uta  Type of Services Patient Feels are Needed: "not sure"   Mental Health Symptoms Depression:  Change in energy/activity; Fatigue; Hopelessness; Increase/decrease in appetite; Irritability; Sleep (too much or  little); Tearfulness; Weight gain/loss; Worthlessness   Duration of Depressive symptoms: Greater than two weeks   Mania:  None   Anxiety:   Fatigue; Sleep; Difficulty concentrating; Tension   Psychosis:  None   Duration of Psychotic symptoms: No data recorded  Trauma:  Detachment from others; Difficulty staying/falling asleep; Emotional numbing; Guilt/shame; Hypervigilance; Re-experience of traumatic event   Obsessions:  N/A   Compulsions:  N/A   Inattention:  N/A   Hyperactivity/Impulsivity:  N/A   Oppositional/Defiant Behaviors:  N/A   Emotional Irregularity:  N/A   Other Mood/Personality Symptoms:  No data recorded   Mental Status Exam Appearance and self-care  Stature:  Average   Weight:  Average weight   Clothing:  Age-appropriate   Grooming:  Normal   Cosmetic use:  None   Posture/gait:  Tense   Motor activity:  Not Remarkable   Sensorium  Attention:  Normal   Concentration:  Normal   Orientation:  X5   Recall/memory:  Normal   Affect and Mood  Affect:  Depressed   Mood:  Depressed   Relating  Eye contact:  Normal   Facial expression:  Depressed; Sad; Tense   Attitude toward examiner:  Cooperative   Thought and Language  Speech flow: Clear and Coherent   Thought content:  Appropriate to Mood and Circumstances   Preoccupation:  None   Hallucinations:  Auditory (hears bullets and voices of friends who died fighting in [place])   Organization:  No data recorded  [employer] of Knowledge:  Good   Intelligence:  Average   Abstraction:  Normal   Judgement:  Fair   Government social research officer   Insight:  Gaps   Decision Making:  Normal   Social Functioning  Social Maturity:  Isolates   Social Judgement:  No data recorded  Stress  Stressors:  Housing; Work; Relationship   Coping Ability:  Overwhelmed; Exhausted; Deficient supports   Skill Deficits:  Decision making; Self-control   Supports:  Support  needed      Exercise/Diet: Exercise/Diet Have You Gained or Lost A Significant Amount of Weight in the Past Six Months?: Yes-Lost Number of Pounds Lost?: 50 (over 2 years) Do You Follow a Special Diet?: No Do You Have Any Trouble Sleeping?: Yes Explanation of Sleeping Difficulties: can't fall asleep- due to PTSD sx   CCA Employment/Education Employment/Work Situation: Employment / Work Situation Employment situation: Unemployed Patient's job has been impacted by current illness: Yes Describe how patient's job has been impacted: pt had job at [place]. He quit when he had surgery in October. Pt states he may be able to return What is the longest time patient has a held a job?: uta Where was the patient employed at that time?: uta Has patient ever been in the [employer]?: No (not [place] military)  Education: Education Is Patient Currently Attending School?: No   CCA Family/Childhood History Family and Relationship History: Family history Does patient have children?: No  Childhood History:  Childhood History By whom was/is the patient raised?: Both parents How were you disciplined when you got in trouble as a child/adolescent?: no abuse inside the home Does patient have siblings?: Yes Number of Siblings: 6 Description of patient's current relationship with siblings: good Did patient suffer any verbal/emotional/physical/sexual abuse  as a child?: Yes (having to fight for the government- he thinks is wrong) Did patient suffer from severe childhood neglect?: No Was the patient ever a victim of a crime or a disaster?: Yes Patient description of being a victim of a crime or disaster: fighting/waring in [place], [place]  Child/Adolescent Assessment:     CCA Substance Use Alcohol/Drug Use: Alcohol / Drug Use Pain Medications: see MAR Prescriptions: see MAR Over the Counter: see MAR History of alcohol / drug use?: Yes Negative Consequences of Use: Financial,Legal,Personal  relationships,Work / School Substance #1 Name of Substance 1: alcohol 1 - Age of First Use: 17 1 - Amount (size/oz): 12 pack beer 1 - Frequency: daily 1 - Last Use / Amount: today, this morning     ASAM's:  Six Dimensions of Multidimensional Assessment  Dimension 1:  Acute Intoxication and/or Withdrawal Potential:   Dimension 1:  Description of individual's past and current experiences of substance use and withdrawal: no apparent intoxication or withdrawl sx  Dimension 2:  Biomedical Conditions and Complications:   Dimension 2:  Description of patient's biomedical conditions and  complications: reports PTSD sx and requesting help  Dimension 3:  Emotional, Behavioral, or Cognitive Conditions and Complications:  Dimension 3:  Description of emotional, behavioral, or cognitive conditions and complications: depression and PTSD sx  Dimension 4:  Readiness to Change:  Dimension 4:  Description of Readiness to Change criteria: states he wants to stop drinking  Dimension 5:  Relapse, Continued use, or Continued Problem Potential:  Dimension 5:  Relapse, continued use, or continued problem potential critiera description: aware of relationship between his MH issues and alcohol use  Dimension 6:  Recovery/Living Environment:  Dimension 6:  Recovery/Iiving environment criteria description: homeless  ASAM Severity Score: ASAM's Severity Rating Score: 8  ASAM Recommended Level of Treatment: ASAM Recommended Level of Treatment: Level II Partial Hospitalization Treatment   Substance use Disorder (SUD) Substance Use Disorder (SUD)  Checklist Symptoms of Substance Use: Continued use despite having a persistent/recurrent physical/psychological problem caused/exacerbated by use,Continued use despite persistent or recurrent social, interpersonal problems, caused or exacerbated by use,Recurrent use that results in a failure to fulfill major role obligations (work, school, home)  Recommendations for  Services/Supports/Treatments: Recommendations for Services/Supports/Treatments Recommendations For Services/Supports/Treatments: CD-IOP Intensive Chemical Dependency Program,Medication Management  DSM5 Diagnoses: Patient Active Problem List   Diagnosis Date Noted  . Alcoholic intoxication without complication ([place])   . Altered mental status 05/11/2020  . Acute respiratory failure ([place])   . Trauma March 30, 2020  . Loss or death of parent March 06, 2016   Disposition: Allen Libra, NP recommends pt follow up with [facility] outpt tx for counseling & med mngt; Pt also given resources for Substance Abuse tx facilities  [place], LCSW

## 2020-06-12 NOTE — Discharge Instructions (Addendum)
Patient is instructed prior to discharge to:  Take all medications as prescribed by his/her mental healthcare provider. Report any adverse effects and or reactions from the medicines to his/her outpatient provider promptly. Keep all scheduled appointments, to ensure that you are getting refills on time and to avoid any interruption in your medication.  If you are unable to keep an appointment call to reschedule.  Be sure to follow-up with resources and follow-up appointments provided.  Patient has been instructed & cautioned: To not engage in alcohol and or illegal drug use while on prescription medicines. In the event of worsening symptoms, patient is instructed to call the crisis hotline, 911 and or go to the nearest ED for appropriate evaluation and treatment of symptoms. To follow-up with his/her primary care provider for your other medical issues, concerns and or health care needs.    Substance abuse resources and Residential Options:  [facility]-14 day residential substance abuse facility (not an option if you have active assault charges). [address] [place], [place] [address] Phone: [phone]: Ask for Myrlene Broker in admissions to complete intake if interested in pursuing this option.  [facility]: Can get intake scheduled; (not an option if you have active assault charges). [address] [place], [place] [phone]) Call Mon-Fri.  [facility]): (offers outpatient therapy and intensive outpatient substance abuse therapy).  [address], [place], [place] [address] Phone: [phone]  [place]: Offers FREE recovery skills classes, support groups, 1:1 Peer Support, and Compeer Classes. [address] [place], [place] [address] Phone: [phone] (Call to complete intake).   [facility] [place], [place] [address] Phone: [phone] ext [phone]  [facility] [place] [facility] provides food,  shelter and other programs and services to the homeless men of [place] through our [employer] program.  By offering safe shelter, three meals a day, clean clothing, Biblical counseling, financial planning, vocational training, GED/education and employment assistance, we've helped mend the shattered lives of many homeless men since opening in 1974.  We have approximately 267 beds available, with a max of 312 beds including mats for emergency situations and currently house an average of 270 men a night.  Prospective Client Check-In Information Photo ID Required (State/ Out of State/ [facility]) - if photo ID is not available, clients are required to have a printout of a police/sheriff's criminal history report. Help out with chores around the [place]. No sex offender of any type (pending, charged, registered and/or any other sex related offenses) will be permitted to check in. Must be willing to abide by all rules, regulations, and policies established by the [employer]. The following will be provided - shelter, food, clothing, and biblical counseling. If you or someone you know is in need of assistance at our [facility] shelter in [place], [place], please call [phone] ext. [phone].    Homeless Shelter List:     Regulatory affairs officer ([place])  [place], [place]  Phone: [phone]     [facility]  [address], [place], [place] [address]  Phone: [phone]     [facility] (Women only)  [address] [place], [place] [address]  Phone: [place]. [place],  [address]  Phone: [phone]     [place]:  [phone]. [address]  [place],  [address]  Phone: [place]  [address], [place], [place] [address]  (Check in at 6:00PM for placement at  a local shelter)  Phone: [phone]

## 2020-06-12 NOTE — Progress Notes (Signed)
CSW spoke with pt, who agreed to be transported to [facility] for detox treatment. Per RN at [facility], male detox beds are available. Pt voiced understanding that he would complete an assessment at [facility] but may not be accepted.   Audree Camel, MSW, LCSW, [place] Clinical Social Worker II Disposition CSW [phone]

## 2020-06-12 NOTE — Discharge Summary (Signed)
Com[place]ore [place] to be D/C'd Rehab (substance abuse treatment) per NP order.  Discussed with the patient and all questions fully answered.  VSS, Skin clean, dry and intact without evidence of skin break down, no evidence of skin tears noted.  An After Visit Summary was printed and given to the patient. Patient received prescription.  D/c education completed with patient including follow up instructions, medication list, d/c activities limitations if indicated, with other d/c instructions as indicated by NP - patient able to verbalize understanding, all questions fully answered.   Patient instructed to return to [place], call 911, or call PCP for any changes in condition.   Patient escorted to [place] via safe transport.  Geraldo Docker 06/12/2020 11:48 AM

## 2020-07-08 ENCOUNTER — Encounter (HOSPITAL_COMMUNITY): Payer: Self-pay | Admitting: *Deleted

## 2020-07-08 ENCOUNTER — Other Ambulatory Visit: Payer: Self-pay

## 2020-07-08 ENCOUNTER — Emergency Department (HOSPITAL_COMMUNITY)
Admission: EM | Admit: 2020-07-08 | Discharge: 2020-07-09 | Disposition: A | Discharge status: Home / Self Care | Payer: Medicaid Other | Attending: Emergency Medicine | Admitting: Emergency Medicine

## 2020-07-08 DIAGNOSIS — W19XXXA Unspecified fall, initial encounter: Secondary | ICD-10-CM

## 2020-07-08 DIAGNOSIS — Y92149 Unspecified place in prison as the place of occurrence of the external cause: Secondary | ICD-10-CM | POA: Insufficient documentation

## 2020-07-08 DIAGNOSIS — S0990XA Unspecified injury of head, initial encounter: Secondary | ICD-10-CM | POA: Insufficient documentation

## 2020-07-08 DIAGNOSIS — F17210 Nicotine dependence, cigarettes, uncomplicated: Secondary | ICD-10-CM | POA: Insufficient documentation

## 2020-07-08 DIAGNOSIS — W07XXXA Fall from chair, initial encounter: Secondary | ICD-10-CM | POA: Insufficient documentation

## 2020-07-08 NOTE — ED Triage Notes (Signed)
Pt here with gpd. Was sitting in a chair and slid out of his chair, hit head on floor. No loc. Pt has no complaints. +etoh.

## 2020-07-09 NOTE — ED Provider Notes (Signed)
Emergency Department Provider Note   I have reviewed the triage vital signs and the nursing notes.   HISTORY  Chief Complaint Fall   HPI Allen Rivas is a 25 y.o. male presents to the emergency department in police custody from jail after falling from the seated position onto the floor.  He endorses drinking alcohol this evening.  Patient was found lying on the ground and due to his intoxication was transferred to the emergency department for further evaluation.  Patient tells me he is not having pain at this time.  He denies numbness or tingling.  No radiation of symptoms or other modifying factors.    History reviewed. No pertinent past medical history.  Patient Active Problem List   Diagnosis Date Noted  . Alcoholic intoxication without complication ([place])   . Altered mental status 05/11/2020  . Acute respiratory failure ([place])   . Trauma 03/31/20  . Loss or death of parent 03/07/16    Past Surgical History:  Procedure Laterality Date  . LAPAROTOMY N/A 03-31-2020   Procedure: EXPLORATORY LAPAROTOMY, SMALL BOWEL RESECTION TIMES TWO.;  Surgeon: Rolm Bookbinder, MD;  Location: [facility];  Service: General;  Laterality: N/A;  . UVULECTOMY      Allergies Patient has no known allergies.  Family History  Family history unknown: Yes    Social History Social History   Tobacco Use  . Smoking status: Current Every Day Smoker    Packs/day: 0.15    Types: Cigarettes  . Smokeless tobacco: Never Used  Vaping Use  . Vaping Use: Never used  Substance Use Topics  . Alcohol use: Yes  . Drug use: Yes    Review of Systems  Constitutional: No fever/chills Eyes: No visual changes. ENT: No sore throat. Cardiovascular: Denies chest pain. Respiratory: Denies shortness of breath. Gastrointestinal: No abdominal pain.  No nausea, no vomiting.  No diarrhea.  No constipation. Genitourinary: Negative for dysuria. Musculoskeletal: Negative for back pain. Skin: Negative for  rash. Neurological: Negative for headaches, focal weakness or numbness.  10-point ROS otherwise negative.  ____________________________________________   PHYSICAL EXAM:  VITAL SIGNS: ED Triage Vitals  Enc Vitals Group     BP 07/08/20 1837 137/87     Pulse Rate 07/08/20 1837 88     Resp 07/08/20 1837 14     Temp 07/08/20 1837 98 F (36.7 C)     Temp Source 07/08/20 1837 Oral     SpO2 07/08/20 1837 97 %   Constitutional: Alert and oriented. Well appearing and in no acute distress. Eyes: Conjunctivae are injected bilaterally w/o exudate.  Head: Atraumatic. Nose: No congestion/rhinnorhea. Mouth/Throat: Mucous membranes are moist.   Neck: No stridor. No cervical spine tenderness to palpation. Cardiovascular: Normal rate, regular rhythm. Good peripheral circulation. Grossly normal heart sounds.   Respiratory: Normal respiratory effort.  No retractions. Lungs CTAB. Gastrointestinal: Soft and nontender. No distention.  Musculoskeletal: No lower extremity tenderness nor edema. No gross deformities of extremities. No midline thoracic or lumbar spine tenderness.  Neurologic:  Normal speech and language. Follows commands briskly.  Equal strength in the bilateral upper and lower extremities. Skin:  Skin is warm, dry and intact. No rash noted.   ____________________________________________  RADIOLOGY  None  ____________________________________________   PROCEDURES  Procedure(s) performed:   Procedures  None  ____________________________________________   INITIAL IMPRESSION / ASSESSMENT AND PLAN / ED COURSE  Pertinent labs & imaging results that were available during my care of the patient were reviewed by me and considered in my  medical decision making (see chart for details).   Patient presents to the emergency department for evaluation after falling from the seated position onto the ground while at jail.  He has been in the emergency department for nearly 7 hours and on  my evaluation awakens to voice and follows commands briskly.  He has no outward sign of head trauma.  No midline cervical, thoracic, lumbar spine tenderness.  No deformity or tenderness to the extremities.  He appears to be sobering clinically based on what was described in the paperwork from jail.  I do not feel he requires CT imaging of the head or cervical spine at this time.  Patient cleared for discharge with Police.    ____________________________________________  FINAL CLINICAL IMPRESSION(S) / ED DIAGNOSES  Final diagnoses:  Injury of head, initial encounter  Fall, initial encounter    Note:  This document was prepared using Dragon voice recognition software and may include unintentional dictation errors.  Nanda Quinton, MD, [facility] Emergency Medicine    Ellene Bloodsaw, [facility], MD 07/09/20 [phone]

## 2021-01-28 ENCOUNTER — Other Ambulatory Visit: Payer: Self-pay

## 2021-01-28 ENCOUNTER — Ambulatory Visit (HOSPITAL_COMMUNITY)
Admission: EM | Admit: 2021-01-28 | Discharge: 2021-01-28 | Disposition: A | Discharge status: Home / Self Care | Payer: Self-pay | Attending: Emergency Medicine | Admitting: Emergency Medicine

## 2021-01-28 ENCOUNTER — Encounter (HOSPITAL_COMMUNITY): Payer: Self-pay

## 2021-01-28 DIAGNOSIS — Z113 Encounter for screening for infections with a predominantly sexual mode of transmission: Secondary | ICD-10-CM

## 2021-01-28 DIAGNOSIS — R300 Dysuria: Secondary | ICD-10-CM

## 2021-01-28 DIAGNOSIS — A549 Gonococcal infection, unspecified: Secondary | ICD-10-CM

## 2021-01-28 LAB — POCT URINALYSIS DIPSTICK, ED / UC
Bilirubin Urine: NEGATIVE
Glucose, UA: NEGATIVE mg/dL
Hgb urine dipstick: NEGATIVE
Ketones, ur: NEGATIVE mg/dL
Leukocytes,Ua: NEGATIVE
Nitrite: NEGATIVE
Protein, ur: NEGATIVE mg/dL
Specific Gravity, Urine: <=1.005 (ref 1.005–1.030)
Urobilinogen, UA: 0.2 mg/dL (ref 0.0–1.0)
pH: 5.5 (ref 5.0–8.0)

## 2021-01-28 MED ORDER — LIDOCAINE HCL (PF) 1 % IJ SOLN
INTRAMUSCULAR | Status: AC
  Filled 2021-01-28: qty 2

## 2021-01-28 MED ORDER — CEFTRIAXONE SODIUM 500 MG IJ SOLR
500.0000 mg | Freq: Once | INTRAMUSCULAR | Status: AC
  Administered 2021-01-28: 500 mg via INTRAMUSCULAR

## 2021-01-28 MED ORDER — CEFTRIAXONE SODIUM 500 MG IJ SOLR
INTRAMUSCULAR | Status: AC
  Filled 2021-01-28: qty 500

## 2021-01-28 NOTE — ED Triage Notes (Signed)
Pt states he tested positive for gonorrhea in jail approx 4 months ago, but did not get treated.

## 2021-01-28 NOTE — ED Provider Notes (Signed)
[place]    CSN: [id number] Arrival date & time: 01/28/21  1024      History   Chief Complaint Chief Complaint  Patient presents with   Dysuria   Penile Discharge    HPI Allen Rivas is a 25 y.o. male.   Patient here for evaluation of dysuria and penile discharge that has been ongoing for the past several months.  Reports getting tested for STDs while in jail 4 months ago and tested positive for gonorrhea.  Reports that he was not treated because he got transferred to another jail.  Denies any trauma, injury, or other precipitating event.  Denies any specific alleviating or aggravating factors.  Denies any fevers, chest pain, shortness of breath, N/V/D, numbness, tingling, weakness, abdominal pain, or headaches.    The history is provided by the patient.  Dysuria Presenting symptoms: dysuria and penile discharge   Presenting symptoms: no penile pain   Associated symptoms: no penile swelling, no scrotal swelling and no urinary frequency   Penile Discharge   History reviewed. No pertinent past medical history.  Patient Active Problem List   Diagnosis Date Noted   Alcoholic intoxication without complication ([place])    Altered mental status 05/11/2020   Acute respiratory failure ([place])    Trauma 04/06/20   Loss or death of parent March 13, 2016    Past Surgical History:  Procedure Laterality Date   LAPAROTOMY N/A 06-Apr-2020   Procedure: EXPLORATORY LAPAROTOMY, SMALL BOWEL RESECTION TIMES TWO.;  Surgeon: Rolm Bookbinder, MD;  Location: [facility];  Service: General;  Laterality: N/A;   UVULECTOMY         Home Medications    Prior to Admission medications   Medication Sig Start Date End Date Taking? Authorizing Provider  gabapentin (NEURONTIN) 300 MG capsule Take 1 capsule (300 mg total) by mouth 3 (three) times daily. Patient not taking: No sig reported 05/16/20   Money, Lowry Ram, FNP  traZODone (DESYREL) 50 MG tablet Take 1 tablet (50 mg total) by mouth at  bedtime as needed for sleep. 06/12/20   Lucky Rathke, FNP  omeprazole (PRILOSEC) 20 MG capsule Take 1 capsule (20 mg total) by mouth daily. 10/24/18 03/14/19  Vanessa Kick, MD    Family History Family History  Family history unknown: Yes    Social History Social History   Tobacco Use   Smoking status: Every Day    Packs/day: 0.15    Types: Cigarettes   Smokeless tobacco: Never  Vaping Use   Vaping Use: Never used  Substance Use Topics   Alcohol use: Yes   Drug use: Yes     Allergies   Patient has no known allergies.   Review of Systems Review of Systems  Genitourinary:  Positive for dysuria and penile discharge. Negative for frequency, genital sores, penile pain, penile swelling, scrotal swelling, testicular pain and urgency.  All other systems reviewed and are negative.   Physical Exam Triage Vital Signs ED Triage Vitals  Enc Vitals Group     BP 01/28/21 1146 (!) 146/89     Pulse Rate 01/28/21 1146 68     Resp 01/28/21 1146 18     Temp 01/28/21 1146 98 F (36.7 C)     Temp Source 01/28/21 1146 Oral     SpO2 01/28/21 1146 95 %     Weight --      Height --      Head Circumference --      Peak Flow --  Pain Score 01/28/21 1145 0     Pain Loc --      Pain Edu? --      Excl. in [place]? --    No data found.  Updated Vital Signs BP (!) 146/89 (BP Location: Right Arm)   Pulse 68   Temp 98 F (36.7 C) (Oral)   Resp 18   SpO2 95%   Visual Acuity Right Eye Distance:   Left Eye Distance:   Bilateral Distance:    Right Eye Near:   Left Eye Near:    Bilateral Near:     Physical Exam Vitals and nursing note reviewed.  Constitutional:      General: He is not in acute distress.    Appearance: Normal appearance. He is not ill-appearing, toxic-appearing or diaphoretic.  HENT:     Head: Normocephalic and atraumatic.  Eyes:     Conjunctiva/sclera: Conjunctivae normal.  Cardiovascular:     Rate and Rhythm: Normal rate.     Pulses: Normal pulses.   Pulmonary:     Effort: Pulmonary effort is normal.  Abdominal:     General: Abdomen is flat.  Genitourinary:    Comments: declines Musculoskeletal:        General: Normal range of motion.     Cervical back: Normal range of motion.  Skin:    General: Skin is warm and dry.  Neurological:     General: No focal deficit present.     Mental Status: He is alert and oriented to person, place, and time.  Psychiatric:        Mood and Affect: Mood normal.     UC Treatments / Results  Labs (all labs ordered are listed, but only abnormal results are displayed) Labs Reviewed  POCT URINALYSIS DIPSTICK, ED / UC  CYTOLOGY, (ORAL, ANAL, URETHRAL) ANCILLARY ONLY    EKG   Radiology No results found.  Procedures Procedures (including critical care time)  Medications Ordered in UC Medications  cefTRIAXone (ROCEPHIN) injection 500 mg (has no administration in time range)    Initial Impression / Assessment and Plan / UC Course  I have reviewed the triage vital signs and the nursing notes.  Pertinent labs & imaging results that were available during my care of the patient were reviewed by me and considered in my medical decision making (see chart for details).    Assessment negative for red flags or concerns.  Self swab obtained.  As patient reports testing positive for gonorrhea but did not receive treatment, we will go ahead and treat with rocephin IM in office.  Declines RPR or HIV screening as he reports those were negative in jail.  Discussed safe sex practices including condom or other barrier method use.  Follow up as needed.  Final Clinical Impressions(s) / UC Diagnoses   Final diagnoses:  Gonorrhea  Dysuria  Screen for STD (sexually transmitted disease)     Discharge Instructions      We will contact you if the results from your lab work are positive and require additional treatment.    You have given rocephin IM in the office today.   Do not have sex while taking  undergoing treatment for STI.  Make sure that all of your partners get tested and treated.   Use a condom or other barrier method for all sexual encounters.    Return or go to the Emergency Department if symptoms worsen or do not improve in the next few days.      ED  Prescriptions   None    PDMP not reviewed this encounter.   Pearson Forster, NP 01/28/21 1218

## 2021-01-28 NOTE — Discharge Instructions (Addendum)
We will contact you if the results from your lab work are positive and require additional treatment.    You have given rocephin IM in the office today.   Do not have sex while taking undergoing treatment for STI.  Make sure that all of your partners get tested and treated.   Use a condom or other barrier method for all sexual encounters.    Return or go to the Emergency Department if symptoms worsen or do not improve in the next few days.

## 2021-01-28 NOTE — ED Triage Notes (Signed)
Pt reports burning when urinating and yellow penile discharge x 2-3 months.

## 2021-01-29 LAB — CYTOLOGY, (ORAL, ANAL, URETHRAL) ANCILLARY ONLY
Chlamydia: NEGATIVE
Comment: NEGATIVE
Comment: NEGATIVE
Comment: NORMAL
Neisseria Gonorrhea: NEGATIVE
Trichomonas: NEGATIVE
